# Patient Record
Sex: Male | Born: 1970 | Race: White | Hispanic: No | Marital: Married | Smoking: Former smoker
Health system: Southern US, Community
[De-identification: ages and names within clinical notes are randomized; demographics above are authoritative.]

## PROBLEM LIST (undated history)

## (undated) DIAGNOSIS — IMO0001 Reserved for inherently not codable concepts without codable children: Secondary | ICD-10-CM

## (undated) DIAGNOSIS — M1990 Unspecified osteoarthritis, unspecified site: Secondary | ICD-10-CM

## (undated) DIAGNOSIS — F419 Anxiety disorder, unspecified: Secondary | ICD-10-CM

## (undated) DIAGNOSIS — K219 Gastro-esophageal reflux disease without esophagitis: Secondary | ICD-10-CM

## (undated) DIAGNOSIS — E079 Disorder of thyroid, unspecified: Secondary | ICD-10-CM

## (undated) DIAGNOSIS — E7800 Pure hypercholesterolemia, unspecified: Secondary | ICD-10-CM

## (undated) DIAGNOSIS — I10 Essential (primary) hypertension: Secondary | ICD-10-CM

## (undated) DIAGNOSIS — Z5189 Encounter for other specified aftercare: Secondary | ICD-10-CM

## (undated) HISTORY — DX: Encounter for other specified aftercare: Z51.89

## (undated) HISTORY — DX: Gastro-esophageal reflux disease without esophagitis: K21.9

## (undated) HISTORY — PX: WISDOM TOOTH EXTRACTION: SHX21

## (undated) HISTORY — PX: CARDIAC CATHETERIZATION: SHX172

---

## 1999-11-18 ENCOUNTER — Encounter: Payer: Self-pay | Admitting: Emergency Medicine

## 1999-11-18 ENCOUNTER — Inpatient Hospital Stay (HOSPITAL_COMMUNITY): Admission: EM | Admit: 1999-11-18 | Discharge: 1999-11-20 | Payer: Self-pay | Admitting: Emergency Medicine

## 1999-11-29 ENCOUNTER — Encounter: Admission: RE | Admit: 1999-11-29 | Discharge: 1999-11-29 | Payer: Self-pay | Admitting: Internal Medicine

## 1999-12-01 ENCOUNTER — Encounter: Payer: Self-pay | Admitting: *Deleted

## 1999-12-01 ENCOUNTER — Ambulatory Visit (HOSPITAL_COMMUNITY): Admission: RE | Admit: 1999-12-01 | Discharge: 1999-12-01 | Payer: Self-pay | Admitting: *Deleted

## 2003-05-31 ENCOUNTER — Emergency Department (HOSPITAL_COMMUNITY): Admission: EM | Admit: 2003-05-31 | Discharge: 2003-06-01 | Payer: Self-pay | Admitting: Emergency Medicine

## 2005-04-28 ENCOUNTER — Ambulatory Visit: Payer: Self-pay | Admitting: Family Medicine

## 2005-06-09 ENCOUNTER — Ambulatory Visit: Payer: Self-pay | Admitting: Family Medicine

## 2005-07-12 ENCOUNTER — Ambulatory Visit: Payer: Self-pay | Admitting: Cardiology

## 2005-07-12 ENCOUNTER — Inpatient Hospital Stay (HOSPITAL_COMMUNITY): Admission: AD | Admit: 2005-07-12 | Discharge: 2005-07-13 | Payer: Self-pay | Admitting: Cardiology

## 2005-09-12 ENCOUNTER — Ambulatory Visit: Payer: Self-pay | Admitting: Family Medicine

## 2006-03-17 ENCOUNTER — Ambulatory Visit: Payer: Self-pay | Admitting: Family Medicine

## 2006-04-14 ENCOUNTER — Ambulatory Visit: Payer: Self-pay | Admitting: Family Medicine

## 2006-04-19 ENCOUNTER — Emergency Department (HOSPITAL_COMMUNITY): Admission: EM | Admit: 2006-04-19 | Discharge: 2006-04-19 | Payer: Self-pay | Admitting: Emergency Medicine

## 2006-05-24 ENCOUNTER — Ambulatory Visit: Payer: Self-pay | Admitting: Family Medicine

## 2006-08-08 ENCOUNTER — Ambulatory Visit: Payer: Self-pay | Admitting: Family Medicine

## 2006-10-13 ENCOUNTER — Ambulatory Visit: Payer: Self-pay | Admitting: Family Medicine

## 2006-12-22 ENCOUNTER — Ambulatory Visit: Payer: Self-pay | Admitting: Family Medicine

## 2007-02-24 ENCOUNTER — Emergency Department (HOSPITAL_COMMUNITY): Admission: EM | Admit: 2007-02-24 | Discharge: 2007-02-24 | Payer: Self-pay | Admitting: Emergency Medicine

## 2010-08-26 ENCOUNTER — Inpatient Hospital Stay (HOSPITAL_COMMUNITY): Admission: EM | Admit: 2010-08-26 | Discharge: 2010-08-28 | Payer: Self-pay | Admitting: Emergency Medicine

## 2011-01-12 LAB — CBC
HCT: 41.3 % (ref 39.0–52.0)
Hemoglobin: 13 g/dL (ref 13.0–17.0)
Hemoglobin: 14.1 g/dL (ref 13.0–17.0)
MCH: 31.1 pg (ref 26.0–34.0)
MCV: 91.6 fL (ref 78.0–100.0)
Platelets: 166 10*3/uL (ref 150–400)
Platelets: 181 10*3/uL (ref 150–400)
RBC: 4.13 MIL/uL — ABNORMAL LOW (ref 4.22–5.81)
RBC: 4.52 MIL/uL (ref 4.22–5.81)
WBC: 14.6 10*3/uL — ABNORMAL HIGH (ref 4.0–10.5)

## 2011-01-12 LAB — BASIC METABOLIC PANEL
BUN: 8 mg/dL (ref 6–23)
CO2: 26 mEq/L (ref 19–32)
Calcium: 8.8 mg/dL (ref 8.4–10.5)
Chloride: 107 mEq/L (ref 96–112)
Creatinine, Ser: 0.75 mg/dL (ref 0.4–1.5)
GFR calc Af Amer: >60 mL/min (ref >60)

## 2011-01-12 LAB — CULTURE, BLOOD (ROUTINE X 2)
Culture: NO GROWTH
Culture: NO GROWTH

## 2011-01-12 LAB — DIFFERENTIAL
Basophils Absolute: 0 10*3/uL (ref 0.0–0.1)
Eosinophils Absolute: 0.1 10*3/uL (ref 0.0–0.7)
Eosinophils Absolute: 0.2 10*3/uL (ref 0.0–0.7)
Eosinophils Relative: 1 % (ref 0–5)
Eosinophils Relative: 1 % (ref 0–5)
Lymphocytes Relative: 12 % (ref 12–46)
Lymphs Abs: 1.7 10*3/uL (ref 0.7–4.0)
Monocytes Absolute: 1.1 10*3/uL — ABNORMAL HIGH (ref 0.1–1.0)
Monocytes Relative: 8 % (ref 3–12)
Neutro Abs: 11.6 10*3/uL — ABNORMAL HIGH (ref 1.7–7.7)
Neutrophils Relative %: 73 % (ref 43–77)

## 2011-01-12 LAB — COMPREHENSIVE METABOLIC PANEL
Alkaline Phosphatase: 61 U/L (ref 39–117)
CO2: 24 mEq/L (ref 19–32)
GFR calc non Af Amer: >60 mL/min (ref >60)
Glucose, Bld: 115 mg/dL — ABNORMAL HIGH (ref 70–99)
Sodium: 139 mEq/L (ref 135–145)
Total Bilirubin: 0.6 mg/dL (ref 0.3–1.2)

## 2011-03-18 NOTE — Cardiovascular Report (Signed)
Christian Lynch. Christian Lynch  Patient:    Christian Lynch                         MRN: [id number] Proc. Date: 12/01/99 Adm. Date:  [id number] Attending:  Lenise Lynch H CC:         Christian Lynch, M.D.                        Cardiac Catheterization  PROCEDURES: 1. Left heart catheterization. 2. Coronary angiography. 3. Left ventriculogram.  COMPLICATIONS:  None.  INDICATIONS:  Christian Lynch is a 40 year old white male, patient of Dr. Robin Lynch and Dr. Lenise Lynch with a history of hypertension and substernal chest pain. He has had one previous hospitalization for chest pain which radiates to his left eck and arm associated with diaphoresis.  Subsequently to that admission, he was scheduled for a stress Cardiolite which he underwent in our office and revealed  mildly depressed EF with no evidence of effusion abnormality.  At that time he as noted to have an ECG with LVH strain pattern with a positive ECG.  However, this interpreted and cautioned in the presence of the LVH.  He has a strong family history of CAD with a mother who died of an MI in her 30s.  The patient was counseled extensively in our office.  However, he wishes to proceed with cardiac catheterization to rule out CAD.  DESCRIPTION OF PROCEDURE:  After given informed written consent, the patient was brought to the cardiac catheterization lab where his right and left groins were  shaved, prepped, and draped in the usual sterile fashion.  ECG monitoring was established.  Using modified Seldinger technique, a #6 French arterial sheath was inserted in the right femoral artery without complications.  A 6 French diagnostic catheter was then used to performed diagnostic angiography.  This revealed a large left main with no significant disease.  The LAD is a medium sized vessel which coursed to the apex and gave rise to one  small diagonal branch.  The LAD was a medium sized vessel which  tapered to a very small vessel in its mid and distal portions.  The first diagonal was a small vessel with no significant disease.  The left circumflex is a large vessel which coursed in the AV groove and gave rise to two obtuse marginal branches as well as a small PDA and is noted to be codominant.  The circumflex proper had no significant disease.  The first OM is a medium sized vessel with 20% ostial stenosis.  The second OM is a large vessel which bifurcates distally and has no significant disease.  The third OM is a medium sized vessel with no significant disease.  The right coronary artery is a medium sized vessel which was also noted to be codominant and gives rise to a small PDA.  The RCA was noted to have some spasm in the proximal portion which was relieved with 200 mg of intracoronary nitroglycerin. There is no significant disease noted in the RCA.  LEFT VENTRICULOGRAM:  The left ventriculogram revealed a mildly depressed.  EF calculated at 45-50% with mild global hypokinesis.  There is no significant MR.   HEMODYNAMICS:  Systemic pressure 144/94, LV systemic pressure 144/13 LVEDP of 14.  CONCLUSIONS: 1. No significant CAD. 2. Mildly depressed left ventricular systolic function. 3. Systemic hypertension. DD:  12/01/99 TD:  12/01/99 Job: [id number]

## 2011-03-18 NOTE — H&P (Signed)
NAME:  Christian Lynch, Christian Lynch NO.:  [id number]   MEDICAL RECORD NO.:  [id number]          PATIENT TYPE:  INP   LOCATION:  2001                         FACILITY:  [facility]   PHYSICIAN:  Learta Codding, M.D. LHCDATE OF BIRTH:  January 22, 1971   DATE OF ADMISSION:  07/12/2005  DATE OF DISCHARGE:                                HISTORY & PHYSICAL   REFERRING PHYSICIAN:  Edward L. Juanetta Gosling, MD.   CARDIOLOGIST - NEW:  Learta Codding, MD, [facility].   REASON FOR ADMISSION:  Atypical substernal chest pain.   HISTORY OF PRESENT ILLNESS:  The patient is a 40 year old white male  formerly seen by Dr. Meredith Pel and Dr. Jenne Campus in 2001.  The patient at that  time presented with chest pain with radiation to the left neck and arm  associated with diaphoresis.  He underwent a cardiac catheterization after  he underwent an outpatient Cardiolite study.  Catheterization was  essentially negative for coronary artery disease.  A stress test  demonstrated LVH with some repolarization changes.   The patient two weeks ago on an admission to [facility] for similar  complaints, he reported sudden onset of substernal chest pain at rest  without nausea or diaphoresis.  The patient's pain was mid sternal and  lasting for several hours.  He was admitted and ruled out for myocardial  infarction and seen by Dr. Olena Leatherwood.  No followup stress testing, however,  was done, and the patient was discharged the next morning.  Today while at  [facility], the patient again complained of an episode of severe, substernal,  chest pain, which he rated an 8/10 but without diaphoresis or nausea.  He  also had no associated shortness of breath.  He initially went to go to  [place] but then decided to go to [facility] ER.  Reportedly he sat there  for several hours until ultimately he received some nitroglycerin.  At that  point, his chest pain eased off from an 8 to a 5/10.  A decision was made to  transfer the patient to [facility] for possible cardiac  catheterization.  The patient's chest pain was resolved on admission to  [facility].  The patient denies any exertional chest pain or exertional  shortness of breath.  He also has two cardiac risk factors and does not  smoke anymore.  Contrary to what has been recorded, the patient does not  have a family history of coronary artery disease.   PAST MEDICAL HISTORY:  1.  Hypothyroidism.  2.  Hypertension.  3.  Anxiety.   ALLERGIES:  MORPHINE causes nausea.   FAMILY HISTORY:  Both mother and father had heart disease but at a later  age.  He has four sisters, who have no heart disease.  He had a brother, who  died from gangrene after trauma.   SOCIAL HISTORY:  The patient works in [employer] and lives with his wife.  He has one child, who is in good health.  He denies any tobacco use.  He  quit approximately four years ago.  He did use to  smoke two packs a day.   MEDICATIONS:  1.  Benicar 20 mg p.o. daily.  2.  Synthroid 150 mcg p.o. daily.  3.  Ativan 0.5 mg p.o. daily.   REVIEW OF SYSTEMS:  As per HPI.  No nausea or vomiting.  No fever or chills.  No orthopnea or PND.  No palpitations or syncope.  No dysuria or frequency.  No melena, no abdominal pain.  The remainder of the Review of Systems is  negative.   PHYSICAL EXAMINATION:  VITAL SIGNS:  Blood pressure 148/97, heart rate 81  beats per minute, respirations 20.  GENERAL:  A well-nourished white male in no apparent distress.  NECK:  Normal carotid upstroke.  No carotid bruits.  LUNGS:  Clear breath sounds bilaterally.  HEART:  Regular rate and rhythm.  Normal S1 and S2.  No S3 or S4.  No  murmurs.  ABDOMEN:  Soft and nontender with no rebound or guarding.  Good bowel  sounds.  EXTREMITIES:  No cyanosis, clubbing, or edema.  NEURO:  The patient is alert, oriented, and grossly nonfocal.   LABORATORY DATA:  Laboratory work performed at [facility] reveals a  sodium of 138, potassium  4.1, chloride 100, carbon dioxide 29, glucose 96,  BUN 9, creatinine 0.9, calcium 9.3,  albumin 4.  LFTs within normal limits.  Initial troponin within normal limits less than 0.5 x2 markers.  The white  count is 8.1, hemoglobin 13.6, platelet count is 213.   EKG demonstrates left ventricular hypertrophy with nonspecific ST T-wave  changes versus a strained pattern.   Chest x-ray pending.   PROBLEM LIST:  1.  Atypical substernal chest pain.      1.  Rule out acute coronary syndrome.  Negative cardiac catheterization          in 2001.      2.  Rule out esophageal spasm.      3.  Musculoskeletal pain.  2.  Hypertension.  3.  Hypothyroidism.  4.  Anxiety.  5.  ALLERGY TO MORPHINE.   PLAN:  1.  Rule out myocardial infarction and check enzymes.  If enzymes are      negative, I do think the patient can proceed with an exercise Cardiolite      study in the morning.  However, there is some discrepancy in the      patient's understanding of the results of his prior cardiac      catheterization.  He stated that he has an ostial 85% stenosis of some      coronaryartery.  He does not know the details.  However, his      catheterization report  does not demonstrate evidence of significant      coronary artery disease, but there are discrepancies in the family      history reported in the catheterization report.  Therefore, angiographic      images probably will need to be reviewed to make sure that the dictation      matches the angiographic data.  If the patient indeed has an 85%      stenosis, the plan may need to be to proceed with cardiac      catheterization.  2.  Aspirin in the interim.  Will hold Lovenox.  The patient has no      recurrent chest pain.  3.  Continue Benicar for hypertension.  4.  Nitroglycerin p.r.n.  Will continue also Alprazolam. Add H2 blocker 5.  I discussed the  plan with the patient and he has agreed to initially      proceed with an exercise Cardiolite data as  long as there is no      discrepancy in his cardiac cath data from 2001.      Learta Codding, M.D. [facility]  Electronically Signed     GED/MEDQ  D:  07/12/2005  T:  07/12/2005  Job:  [id number]   cc:   Ramon Dredge L. Juanetta Gosling, M.D.  [address]  [place]  [place] [address]  Fax: [phone]

## 2011-03-18 NOTE — Discharge Summary (Signed)
NAME:  Christian Lynch, Christian Lynch NO.:  [id number]   MEDICAL RECORD NO.:  [id number]          PATIENT TYPE:  INP   LOCATION:  2001                         FACILITY:  [facility]   PHYSICIAN:  Olga Millers, M.D. LHCDATE OF BIRTH:  1971-10-29   DATE OF ADMISSION:  07/12/2005  DATE OF DISCHARGE:  07/13/2005                                 DISCHARGE SUMMARY   PRIMARY CARDIOLOGIST:  Dr. Lewayne Bunting.   PRIMARY CARE PHYSICIAN:  Dr. Juanetta Gosling in [place].   PRINCIPAL DIAGNOSIS:  Atypical chest pain.   OTHER DIAGNOSES:  1.  Hypertension.  2.  Anxiety.  3.  Hypothyroidism.   ALLERGIES:  MORPHINE.   PROCEDURES:  Exercise Myoview.   HISTORY OF PRESENT ILLNESS:  A 40 year old white male with a prior history  of chest pain and normal catheterization in 2001.  He was recently evaluated  at [facility] approximately 2 weeks ago secondary to complaints of  mid sternal chest discomfort occurring at rest without associated symptoms,  lasting several hours.  He was ruled out during that admission and  discharged the following morning.  On September 12th, while out at [employer], he developed recurrent 8/10 substernal chest pain without  associated symptoms and initially went to go to [place] but then decided to  go to [facility] ER.  He was treated with sublingual nitroglycerin with  reduction of chest pain from 8/10 to 5/10 and decision was made to transfer  him to [facility] for further evaluation.   HOSPITAL COURSE:  Following arrival to [facility] he was pain-free.  Cardiac  markers were cycled and were negative.  As a result, he underwent exercise  Myoview this morning, exercising for a total of 9 minutes, achieving a  target heart rate of 156 b.p.m.  He had no chest pain or significant EKG  changes and only mild dyspnea with exercise.  Final procedure and imaging  revealed no areas of ischemia or infarct.  As a result, he has been  initiated on PPI therapy and is being  discharged home today in satisfactory  condition.   DISCHARGE LABS:  Hemoglobin 13.6, hematocrit 39.5, WBC 8.1, platelets 213,  MCV 88.6.  Sodium 138, potassium 4.1, chloride 100, CO2 29, BUN 9,  creatinine 0.9, glucose 96.  Total bilirubin 0.3, alkaline phosphatase 73,  AST 26, ALT 33, albumin 4.0.  Cardiac enzymes negative x2.  Calcium 9.3, D-  dimer was negative at 0.25, BNP was less than 30, TSH was normal at 0.420.   DISPOSITION:  The patient is being discharged home today in good condition.   FOLLOWUP PLANS AND APPOINTMENTS:  He is asked to followup with his primary  care physician, Dr. Juanetta Gosling, in [place] in 1-2 weeks.  He is to followup  with Dr. Andee Lineman at [facility], in [place], on September 27th at 12:30  p.m.   DISCHARGE MEDICATIONS:  1.  Synthroid 175 mcg daily.  2.  Ativan 0.5 mg t.i.d.  3.  Benicar 20 mg daily.  4.  Protonix 40 mg daily.   OUTSTANDING LAB STUDIES:  None.   DURATION  OF DISCHARGE ENCOUNTER:  35 minutes.      Ok Anis, NP    ______________________________  Olga Millers, M.D. [facility]    CRB/MEDQ  D:  07/13/2005  T:  07/13/2005  Job:  [id number]   cc:   Learta Codding, M.D. [facility]  [address]  [address]  [place]  [place] [address]   Oneal Deputy. Juanetta Gosling, M.D.  [address]  [place]  [place] [address]  Fax: [phone]

## 2014-12-27 ENCOUNTER — Encounter (HOSPITAL_COMMUNITY): Payer: Self-pay | Admitting: *Deleted

## 2014-12-27 ENCOUNTER — Emergency Department (HOSPITAL_COMMUNITY)
Admission: EM | Admit: 2014-12-27 | Discharge: 2014-12-28 | Disposition: A | Discharge status: Home / Self Care | Payer: BLUE CROSS/BLUE SHIELD | Attending: Emergency Medicine | Admitting: Emergency Medicine

## 2014-12-27 DIAGNOSIS — E079 Disorder of thyroid, unspecified: Secondary | ICD-10-CM | POA: Insufficient documentation

## 2014-12-27 DIAGNOSIS — Z79899 Other long term (current) drug therapy: Secondary | ICD-10-CM | POA: Diagnosis not present

## 2014-12-27 DIAGNOSIS — E780 Pure hypercholesterolemia: Secondary | ICD-10-CM | POA: Insufficient documentation

## 2014-12-27 DIAGNOSIS — K529 Noninfective gastroenteritis and colitis, unspecified: Secondary | ICD-10-CM | POA: Diagnosis not present

## 2014-12-27 DIAGNOSIS — R112 Nausea with vomiting, unspecified: Secondary | ICD-10-CM | POA: Diagnosis present

## 2014-12-27 DIAGNOSIS — Z792 Long term (current) use of antibiotics: Secondary | ICD-10-CM | POA: Diagnosis not present

## 2014-12-27 DIAGNOSIS — K219 Gastro-esophageal reflux disease without esophagitis: Secondary | ICD-10-CM | POA: Insufficient documentation

## 2014-12-27 DIAGNOSIS — Z720 Tobacco use: Secondary | ICD-10-CM | POA: Insufficient documentation

## 2014-12-27 DIAGNOSIS — F419 Anxiety disorder, unspecified: Secondary | ICD-10-CM | POA: Diagnosis not present

## 2014-12-27 DIAGNOSIS — I10 Essential (primary) hypertension: Secondary | ICD-10-CM | POA: Diagnosis not present

## 2014-12-27 HISTORY — DX: Anxiety disorder, unspecified: F41.9

## 2014-12-27 HISTORY — DX: Reserved for inherently not codable concepts without codable children: IMO0001

## 2014-12-27 HISTORY — DX: Essential (primary) hypertension: I10

## 2014-12-27 HISTORY — DX: Gastro-esophageal reflux disease without esophagitis: K21.9

## 2014-12-27 HISTORY — DX: Pure hypercholesterolemia, unspecified: E78.00

## 2014-12-27 HISTORY — DX: Disorder of thyroid, unspecified: E07.9

## 2014-12-27 HISTORY — DX: Unspecified osteoarthritis, unspecified site: M19.90

## 2014-12-27 LAB — URINALYSIS, ROUTINE W REFLEX MICROSCOPIC
BILIRUBIN URINE: NEGATIVE
GLUCOSE, UA: NEGATIVE mg/dL
HGB URINE DIPSTICK: NEGATIVE
Ketones, ur: NEGATIVE mg/dL
LEUKOCYTES UA: NEGATIVE
Nitrite: NEGATIVE
PROTEIN: NEGATIVE mg/dL
SPECIFIC GRAVITY, URINE: 1.025 (ref 1.005–1.030)
UROBILINOGEN UA: 0.2 mg/dL (ref 0.0–1.0)
pH: 7 (ref 5.0–8.0)

## 2014-12-27 LAB — COMPREHENSIVE METABOLIC PANEL
ALK PHOS: 57 U/L (ref 39–117)
ALT: 32 U/L (ref 0–53)
AST: 24 U/L (ref 0–37)
Albumin: 4.3 g/dL (ref 3.5–5.2)
Anion gap: 10 (ref 5–15)
BILIRUBIN TOTAL: 0.6 mg/dL (ref 0.3–1.2)
BUN: 15 mg/dL (ref 6–23)
CALCIUM: 9 mg/dL (ref 8.4–10.5)
CO2: 24 mmol/L (ref 19–32)
CREATININE: 0.87 mg/dL (ref 0.50–1.35)
Chloride: 105 mmol/L (ref 96–112)
GFR calc Af Amer: >90 mL/min (ref >90)
Glucose, Bld: 103 mg/dL — ABNORMAL HIGH (ref 70–99)
Potassium: 3.3 mmol/L — ABNORMAL LOW (ref 3.5–5.1)
Sodium: 139 mmol/L (ref 135–145)
TOTAL PROTEIN: 7.6 g/dL (ref 6.0–8.3)

## 2014-12-27 LAB — CBC WITH DIFFERENTIAL/PLATELET
Basophils Absolute: 0 10*3/uL (ref 0.0–0.1)
Basophils Relative: 0 % (ref 0–1)
EOS ABS: 0.4 10*3/uL (ref 0.0–0.7)
EOS PCT: 3 % (ref 0–5)
HCT: 43.9 % (ref 39.0–52.0)
Hemoglobin: 15.6 g/dL (ref 13.0–17.0)
LYMPHS PCT: 26 % (ref 12–46)
Lymphs Abs: 3.4 10*3/uL (ref 0.7–4.0)
MCH: 32.8 pg (ref 26.0–34.0)
MCHC: 35.5 g/dL (ref 30.0–36.0)
MCV: 92.2 fL (ref 78.0–100.0)
MONOS PCT: 7 % (ref 3–12)
Monocytes Absolute: 0.9 10*3/uL (ref 0.1–1.0)
NEUTROS ABS: 8.3 10*3/uL — AB (ref 1.7–7.7)
NEUTROS PCT: 64 % (ref 43–77)
Platelets: 160 10*3/uL (ref 150–400)
RBC: 4.76 MIL/uL (ref 4.22–5.81)
RDW: 12.5 % (ref 11.5–15.5)
WBC: 13 10*3/uL — ABNORMAL HIGH (ref 4.0–10.5)

## 2014-12-27 MED ORDER — PROMETHAZINE HCL 25 MG/ML IJ SOLN
12.5000 mg | Freq: Once | INTRAMUSCULAR | Status: AC
  Administered 2014-12-27: 12.5 mg via INTRAVENOUS
  Filled 2014-12-27: qty 1

## 2014-12-27 MED ORDER — PANTOPRAZOLE SODIUM 40 MG IV SOLR
40.0000 mg | Freq: Once | INTRAVENOUS | Status: AC
Start: 2014-12-27 — End: 2014-12-27
  Administered 2014-12-27: 40 mg via INTRAVENOUS
  Filled 2014-12-27: qty 40

## 2014-12-27 MED ORDER — SODIUM CHLORIDE 0.9 % IV BOLUS (SEPSIS)
1000.0000 mL | Freq: Once | INTRAVENOUS | Status: AC
  Administered 2014-12-27: 1000 mL via INTRAVENOUS

## 2014-12-27 NOTE — ED Provider Notes (Signed)
CSN: [id number]     Arrival date & time 12/27/14  2118 History  This chart was scribed for Donnetta Hutching, MD by [place] Peace, ED Scribe. The patient was seen in [facility]. The patient's care was started at 10:21 PM.     Chief Complaint  Patient presents with  . Emesis  . Otalgia      Patient is a 44 y.o. male presenting with vomiting and ear pain. The history is provided by the patient. No language interpreter was used.  Emesis Associated symptoms: abdominal pain and sore throat   Otalgia Associated symptoms: abdominal pain, cough, fever, sore throat and vomiting   HPI Comments: Christian Lynch is a 44 y.o. male who presents to the Emergency Department complaining of ear pain, sore throat, cough, headache, and fever x 1 week. Pt also reports nausea, 5 episodes of vomiting, and cramping that started today. Wife denies any recent sick contacts. She adds that pt has been on clarithromycin since Tuesday that he was prescribed during a visit at [facility]. Blood noted in emesis while pt was in waiting room. History of anxiety, hypertension. Pt is current everyday smoker.    Past Medical History  Diagnosis Date  . Thyroid disease   . Reflux   . Arthritis   . Hypertension   . Hypercholesterolemia   . Anxiety    History reviewed. No pertinent past surgical history. History reviewed. No pertinent family history. History  Substance Use Topics  . Smoking status: Current Every Day Smoker -- 1.00 packs/day    Types: Cigarettes  . Smokeless tobacco: Not on file  . Alcohol Use: Yes     Comment: rare social    Review of Systems  Constitutional: Positive for fever.  HENT: Positive for ear pain and sore throat.   Respiratory: Positive for cough.   Gastrointestinal: Positive for nausea, vomiting and abdominal pain.  Genitourinary: Positive for hematuria.      Allergies  Morphine and related and Zofran  Home Medications   Prior to Admission medications   Medication Sig Start Date End  Date Taking? Authorizing Provider  clarithromycin (BIAXIN XL) 500 MG 24 hr tablet Take 500 mg by mouth 2 (two) times daily. 12/23/14  Yes Historical Provider, MD  levothyroxine (SYNTHROID, LEVOTHROID) 112 MCG tablet Take 112 mcg by mouth at bedtime.  10/17/14  Yes Historical Provider, MD  losartan-hydrochlorothiazide (HYZAAR) 50-12.5 MG per tablet Take 1 tablet by mouth at bedtime.  10/17/14  Yes Historical Provider, MD  meloxicam (MOBIC) 7.5 MG tablet Take 15 mg by mouth at bedtime.  10/17/14  Yes Historical Provider, MD  Omega-3 Fatty Acids (FISH OIL) 1000 MG CAPS Take 3 capsules by mouth at bedtime.   Yes Historical Provider, MD  omeprazole (PRILOSEC) 20 MG capsule Take 20 mg by mouth at bedtime.  10/17/14  Yes Historical Provider, MD  pravastatin (PRAVACHOL) 80 MG tablet Take 80 mg by mouth at bedtime.  10/17/14  Yes Historical Provider, MD  famotidine (PEPCID) 20 MG tablet Take 1 tablet (20 mg total) by mouth 2 (two) times daily. 12/28/14   Donnetta Hutching, MD  LORazepam (ATIVAN) 1 MG tablet Take 1 mg by mouth 3 (three) times daily as needed. 10/17/14   Historical Provider, MD  promethazine (PHENERGAN) 25 MG tablet Take 1 tablet (25 mg total) by mouth every 6 (six) hours as needed. 12/28/14   Donnetta Hutching, MD   BP 177/100 mmHg  Pulse 63  Temp(Src) 98.7 F (37.1 C) (Oral)  Resp 14  Ht 5\' 7"  (1.702 m)  Wt 150 lb (68.04 kg)  BMI 23.49 kg/m2  SpO2 100% Physical Exam  Constitutional: He is oriented to person, place, and time. He appears well-developed and well-nourished.  HENT:  Head: Normocephalic and atraumatic.  Eyes: Conjunctivae and EOM are normal. Pupils are equal, round, and reactive to light.  Neck: Normal range of motion. Neck supple.  Cardiovascular: Normal rate and regular rhythm.   Pulmonary/Chest: Effort normal and breath sounds normal.  Abdominal: Soft. Bowel sounds are normal.  Musculoskeletal: Normal range of motion.  Neurological: He is alert and oriented to person, place, and  time.  Skin: Skin is warm and dry.  Psychiatric: He has a normal mood and affect. His behavior is normal.  Nursing note and vitals reviewed.   ED Course  Procedures (including critical care time) Labs Review Labs Reviewed  CBC WITH DIFFERENTIAL/PLATELET - Abnormal; Notable for the following:    WBC 13.0 (*)    Neutro Abs 8.3 (*)    All other components within normal limits  COMPREHENSIVE METABOLIC PANEL - Abnormal; Notable for the following:    Potassium 3.3 (*)    Glucose, Bld 103 (*)    All other components within normal limits  URINALYSIS, ROUTINE W REFLEX MICROSCOPIC    Imaging Review No results found.   EKG Interpretation None     Medications  sodium chloride 0.9 % bolus 1,000 mL (0 mLs Intravenous Stopped 12/28/14 0054)  pantoprazole (PROTONIX) injection 40 mg (40 mg Intravenous Given 12/27/14 2259)  promethazine (PHENERGAN) injection 12.5 mg (12.5 mg Intravenous Given 12/27/14 2259)  sodium chloride 0.9 % bolus 1,000 mL (0 mLs Intravenous Stopped 12/27/14 2345)    10:25 PM- Treatment plan was discussed with patient who verbalizes understanding and agrees.   MDM   Final diagnoses:  Gastroenteritis    No acute abdomen.  Patient feels much better after 2 L of IV fluids, IV Protonix, IV Phenergan. Suspect idiopathic gastroenteritis.discharge medications Phenergan 25 mg and Pepcid 20 mg. Discussed with patient and his wife.  I personally performed the services described in this documentation, which was scribed in my presence. The recorded information has been reviewed and is accurate.    Donnetta HutchingBrian Ellie Spickler, MD 12/28/14 [phone]

## 2014-12-27 NOTE — ED Notes (Signed)
MD at bedside. 

## 2014-12-27 NOTE — ED Notes (Addendum)
Ear pain, sore throat, cough, fevers x 1 week.  Nausea, vomiting began yesterday.  5 x in last 2 hours. Blood in emesis while in waiting room.  Was seen at [facility] last weekend and has been on Clarithromycin since then.

## 2014-12-28 MED ORDER — PROMETHAZINE HCL 25 MG PO TABS: 25.0000 mg | ORAL_TABLET | Freq: Four times a day (QID) | ORAL | Status: DC | PRN

## 2014-12-28 MED ORDER — FAMOTIDINE 20 MG PO TABS: 20.0000 mg | ORAL_TABLET | Freq: Two times a day (BID) | ORAL | Status: DC

## 2014-12-28 NOTE — Discharge Instructions (Signed)
Stop smoking. Stop your antibiotic. Prescriptions for nausea medication and ulcer medication. Increase fluids.

## 2017-04-29 ENCOUNTER — Encounter (HOSPITAL_COMMUNITY): Payer: Self-pay | Admitting: *Deleted

## 2017-04-29 ENCOUNTER — Emergency Department (HOSPITAL_COMMUNITY)
Admission: EM | Admit: 2017-04-29 | Discharge: 2017-04-30 | Disposition: A | Discharge status: Home / Self Care | Payer: PRIVATE HEALTH INSURANCE | Attending: Emergency Medicine | Admitting: Emergency Medicine

## 2017-04-29 DIAGNOSIS — E876 Hypokalemia: Secondary | ICD-10-CM | POA: Insufficient documentation

## 2017-04-29 DIAGNOSIS — E860 Dehydration: Secondary | ICD-10-CM

## 2017-04-29 DIAGNOSIS — Z79899 Other long term (current) drug therapy: Secondary | ICD-10-CM | POA: Insufficient documentation

## 2017-04-29 DIAGNOSIS — Z791 Long term (current) use of non-steroidal anti-inflammatories (NSAID): Secondary | ICD-10-CM | POA: Insufficient documentation

## 2017-04-29 DIAGNOSIS — F17210 Nicotine dependence, cigarettes, uncomplicated: Secondary | ICD-10-CM | POA: Insufficient documentation

## 2017-04-29 DIAGNOSIS — I10 Essential (primary) hypertension: Secondary | ICD-10-CM | POA: Insufficient documentation

## 2017-04-29 DIAGNOSIS — R531 Weakness: Secondary | ICD-10-CM | POA: Insufficient documentation

## 2017-04-29 DIAGNOSIS — R51 Headache: Secondary | ICD-10-CM | POA: Insufficient documentation

## 2017-04-29 MED ORDER — SODIUM CHLORIDE 0.9 % IV BOLUS (SEPSIS)
500.0000 mL | Freq: Once | INTRAVENOUS | Status: AC
  Administered 2017-04-30: 500 mL via INTRAVENOUS

## 2017-04-29 MED ORDER — SODIUM CHLORIDE 0.9 % IV BOLUS (SEPSIS)
1000.0000 mL | Freq: Once | INTRAVENOUS | Status: AC
  Administered 2017-04-30: 1000 mL via INTRAVENOUS

## 2017-04-29 NOTE — ED Triage Notes (Addendum)
Pt reports having several episodes of feeling like he was going pass out x 2 weeks (or longer). Pt also reports having a headache x 2-3 days, seeing black spots, and nausea. Pt was seen on June 22nd for similar symptoms and the doctor "didn't tell him anything really."

## 2017-04-29 NOTE — ED Notes (Signed)
Patient states that he is having a headache on pain scale of 2 with some nausea. States that he had some blurry vision early.

## 2017-04-30 ENCOUNTER — Emergency Department (HOSPITAL_COMMUNITY): Payer: PRIVATE HEALTH INSURANCE

## 2017-04-30 LAB — CBC WITH DIFFERENTIAL/PLATELET
Basophils Absolute: 0.1 10*3/uL (ref 0.0–0.1)
Basophils Relative: 1 %
Eosinophils Absolute: 0.4 10*3/uL (ref 0.0–0.7)
Eosinophils Relative: 4 %
HCT: 42.5 % (ref 39.0–52.0)
Hemoglobin: 14.5 g/dL (ref 13.0–17.0)
LYMPHS ABS: 3.1 10*3/uL (ref 0.7–4.0)
LYMPHS PCT: 32 %
MCH: 32.5 pg (ref 26.0–34.0)
MCHC: 34.1 g/dL (ref 30.0–36.0)
MCV: 95.3 fL (ref 78.0–100.0)
MONO ABS: 0.6 10*3/uL (ref 0.1–1.0)
Monocytes Relative: 6 %
Neutro Abs: 5.6 10*3/uL (ref 1.7–7.7)
Neutrophils Relative %: 57 %
Platelets: 168 10*3/uL (ref 150–400)
RBC: 4.46 MIL/uL (ref 4.22–5.81)
RDW: 13.2 % (ref 11.5–15.5)
WBC: 9.7 10*3/uL (ref 4.0–10.5)

## 2017-04-30 LAB — BASIC METABOLIC PANEL
Anion gap: 10 (ref 5–15)
BUN: 13 mg/dL (ref 6–20)
CALCIUM: 8.9 mg/dL (ref 8.9–10.3)
CO2: 26 mmol/L (ref 22–32)
CREATININE: 0.81 mg/dL (ref 0.61–1.24)
Chloride: 106 mmol/L (ref 101–111)
GFR calc Af Amer: >60 mL/min (ref >60)
GLUCOSE: 100 mg/dL — AB (ref 65–99)
Potassium: 3.3 mmol/L — ABNORMAL LOW (ref 3.5–5.1)
Sodium: 142 mmol/L (ref 135–145)

## 2017-04-30 LAB — URINALYSIS, ROUTINE W REFLEX MICROSCOPIC
BILIRUBIN URINE: NEGATIVE
GLUCOSE, UA: NEGATIVE mg/dL
HGB URINE DIPSTICK: NEGATIVE
KETONES UR: NEGATIVE mg/dL
Leukocytes, UA: NEGATIVE
Nitrite: NEGATIVE
Protein, ur: NEGATIVE mg/dL
Specific Gravity, Urine: 1.025 (ref 1.005–1.030)
pH: 5 (ref 5.0–8.0)

## 2017-04-30 LAB — CK: Total CK: 104 U/L (ref 49–397)

## 2017-04-30 MED ORDER — POTASSIUM CHLORIDE ER 20 MEQ PO TBCR: 20.0000 meq | EXTENDED_RELEASE_TABLET | Freq: Two times a day (BID) | ORAL | 0 refills | Status: DC

## 2017-04-30 MED ORDER — POTASSIUM CHLORIDE CRYS ER 20 MEQ PO TBCR
40.0000 meq | EXTENDED_RELEASE_TABLET | Freq: Once | ORAL | Status: AC
  Administered 2017-04-30: 40 meq via ORAL
  Filled 2017-04-30: qty 2

## 2017-04-30 MED ORDER — SODIUM CHLORIDE 0.9 % IV BOLUS (SEPSIS)
1000.0000 mL | Freq: Once | INTRAVENOUS | Status: AC
  Administered 2017-04-30: 1000 mL via INTRAVENOUS

## 2017-04-30 NOTE — Discharge Instructions (Signed)
Drink more fluids like sports drinks when working in a hot environment. Your potassium level was a little low in the ED, take the potassium pills until gone. Please have your doctor recheck your blood pressure this week, your blood pressure was a little high in the ED at 175/99.

## 2017-04-30 NOTE — ED Provider Notes (Signed)
[facility] Provider Note   CSN: [id number] Arrival date & time: 04/29/17  2129  Time seen 23:42 PM   History   Chief Complaint Chief Complaint  Patient presents with  . Headache  . Fatigue    HPI Christian Lynch is a 46 y.o. male.  HPI  patient states for the past week he's had nausea without vomiting or diarrhea. He complains of lower abdominal cramps bilaterally that come and go and last about 30 minutes. Nothing he does including eating makes it feel better or worse. He also complains of intermittent frontal headaches that radiate to the back of his head described as sharp that last 1-2 hours. He states they are throbbing and sometimes makes him have blurred vision. He denies nausea or vomiting or new numbness or tingling in his extremities with the headache. He states his hands get numb whenever he doesn't use them for a while and that has been going on for years and is not associated with the headache. He denies any fever and denies any recent exposure to ticks although he states he was seen last week by his PCP and had negative tests for [facility] spotted fever or Lyme's disease. He states the past 2 mornings at work including this morning around 8 he was working in a hot environment and sweating a lot. He was having abdominal pain and nausea and felt like he was going to pass out. He has been working at this job for the past 4 months and states it's very hot because they are working around [employer]. He states he has felt short of breath for 2-3 days and has had a dry cough without chest pain. He states he notices he feels worse at the end of his working day and feels better when he has had a few days off. He has been seen by his PCP twice in the last 2 weeks. He used to be on blood pressure medication however he did not take them for 2 years. He states when he was in the office this past week twice his blood pressure was fine. He states his doctor decided to not put him back  on blood pressure medication.  PCP Joette Catching, MD   Past Medical History:  Diagnosis Date  . Anxiety   . Arthritis   . Hypercholesterolemia   . Hypertension   . Reflux   . Thyroid disease     There are no active problems to display for this patient.   History reviewed. No pertinent surgical history.     Home Medications    Prior to Admission medications   Medication Sig Start Date End Date Taking? Authorizing Provider  acetaminophen (TYLENOL) 500 MG tablet Take 1,000 mg by mouth every 6 (six) hours as needed for mild pain, moderate pain or headache.   Yes [provider]  levothyroxine (SYNTHROID, LEVOTHROID) 25 MCG tablet Take 25 mcg by mouth at bedtime. Take during week 1, then increase to during week 2, then increase to during week 3, then increase to during week 4, and finally increase to during week 5 and stay at that dose 10/17/14  Yes [provider]  meloxicam (MOBIC) 7.5 MG tablet Take 15 mg by mouth at bedtime.  10/17/14  Yes [provider]  omeprazole (PRILOSEC) 20 MG capsule Take 20 mg by mouth at bedtime.  10/17/14  Yes [provider]  potassium chloride 20 MEQ TBCR Take 20 mEq by mouth  2 (two) times daily. 04/30/17   Devoria Albe, MD    Family History History reviewed. No pertinent family history.  Social History Social History  Substance Use Topics  . Smoking status: Current Every Day Smoker    Packs/day: 1.00    Types: Cigarettes  . Smokeless tobacco: Never Used  . Alcohol use Yes     Comment: rare social  employed Lives with spouse   Allergies   Clarithromycin; Morphine and related; and Zofran [ondansetron hcl]   Review of Systems Review of Systems  All other systems reviewed and are negative.    Physical Exam Updated Vital Signs BP (!) 174/109 (BP Location: Right Arm)   Pulse 60   Temp 98.7 F (37.1 C) (Oral)   Resp (!) 21   Ht 5\' 7"  (1.702 m)   Wt 63.5 kg (140  lb)   SpO2 98%   BMI 21.93 kg/m   Vital signs normal except hypertension  Orthostatic VS for the past 24 hrs:  BP- Lying Pulse- Lying BP- Sitting Pulse- Sitting BP- Standing at 0 minutes Pulse- Standing at 0 minutes  04/29/17 2335 (!) 168/101 57 (!) 177/111 59 (!) 174/109 54     Normal orthostatics except for persistent hypertension    Physical Exam  Constitutional: He is oriented to person, place, and time. He appears well-developed and well-nourished.  Non-toxic appearance. He does not appear ill. No distress.  HENT:  Head: Normocephalic and atraumatic.  Right Ear: External ear normal.  Left Ear: External ear normal.  Nose: Nose normal. No mucosal edema or rhinorrhea.  Mouth/Throat: Oropharynx is clear and moist. Mucous membranes are dry. No dental abscesses or uvula swelling.  Eyes: Conjunctivae and EOM are normal. Pupils are equal, round, and reactive to light.  Neck: Normal range of motion and full passive range of motion without pain. Neck supple.  Cardiovascular: Normal rate, regular rhythm and normal heart sounds.  Exam reveals no gallop and no friction rub.   No murmur heard. Pulmonary/Chest: Effort normal and breath sounds normal. No respiratory distress. He has no wheezes. He has no rhonchi. He has no rales. He exhibits no tenderness and no crepitus.  Abdominal: Soft. Normal appearance and bowel sounds are normal. He exhibits no distension. There is no tenderness. There is no rebound and no guarding.  Musculoskeletal: Normal range of motion. He exhibits no edema or tenderness.  Moves all extremities well.   Neurological: He is alert and oriented to person, place, and time. He has normal strength. No cranial nerve deficit.  Skin: Skin is warm, dry and intact. No rash noted. No erythema. No pallor.  Psychiatric: He has a normal mood and affect. His speech is normal and behavior is normal. His mood appears not anxious.  Nursing note and vitals reviewed.    ED  Treatments / Results  Labs (all labs ordered are listed, but only abnormal results are displayed) Results for orders placed or performed during the hospital encounter of 04/29/17  CBC with Differential  Result Value Ref Range   WBC 9.7 4.0 - 10.5 K/uL   RBC 4.46 4.22 - 5.81 MIL/uL   Hemoglobin 14.5 13.0 - 17.0 g/dL   HCT 16.1 09.6 - 04.5 %   MCV 95.3 78.0 - 100.0 fL   MCH 32.5 26.0 - 34.0 pg   MCHC 34.1 30.0 - 36.0 g/dL   RDW 40.9 81.1 - 91.4 %   Platelets 168 150 - 400 K/uL   Neutrophils Relative % 57 %  Neutro Abs 5.6 1.7 - 7.7 K/uL   Lymphocytes Relative 32 %   Lymphs Abs 3.1 0.7 - 4.0 K/uL   Monocytes Relative 6 %   Monocytes Absolute 0.6 0.1 - 1.0 K/uL   Eosinophils Relative 4 %   Eosinophils Absolute 0.4 0.0 - 0.7 K/uL   Basophils Relative 1 %   Basophils Absolute 0.1 0.0 - 0.1 K/uL  Basic metabolic panel  Result Value Ref Range   Sodium 142 135 - 145 mmol/L   Potassium 3.3 (L) 3.5 - 5.1 mmol/L   Chloride 106 101 - 111 mmol/L   CO2 26 22 - 32 mmol/L   Glucose, Bld 100 (H) 65 - 99 mg/dL   BUN 13 6 - 20 mg/dL   Creatinine, Ser 1.61 0.61 - 1.24 mg/dL   Calcium 8.9 8.9 - 09.6 mg/dL   GFR calc non Af Amer >60 >60 mL/min   GFR calc Af Amer >60 >60 mL/min   Anion gap 10 5 - 15  CK  Result Value Ref Range   Total CK 104 49 - 397 U/L  Urinalysis, Routine w reflex microscopic  Result Value Ref Range   Color, Urine YELLOW YELLOW   APPearance CLEAR CLEAR   Specific Gravity, Urine 1.025 1.005 - 1.030   pH 5.0 5.0 - 8.0   Glucose, UA NEGATIVE NEGATIVE mg/dL   Hgb urine dipstick NEGATIVE NEGATIVE   Bilirubin Urine NEGATIVE NEGATIVE   Ketones, ur NEGATIVE NEGATIVE mg/dL   Protein, ur NEGATIVE NEGATIVE mg/dL   Nitrite NEGATIVE NEGATIVE   Leukocytes, UA NEGATIVE NEGATIVE   Laboratory interpretation all normal except mild hypokalemia     EKG  EKG Interpretation  Date/Time:  Saturday April 29 2017 23:43:29 EDT Ventricular Rate:  59 PR Interval:    QRS  Duration: 107 QT Interval:  436 QTC Calculation: 432 R Axis:   -11 Text Interpretation:  Sinus rhythm Left ventricular hypertrophy No significant change since last tracing 19 Nov 1999 Confirmed by Devoria Albe ([id number]) on 04/29/2017 11:57:01 PM       Radiology Dg Chest 2 View  Result Date: 04/30/2017 CLINICAL DATA:  Shortness of breath with dry cough EXAM: CHEST  2 VIEW COMPARISON:  Report 12/23/2014 FINDINGS: No acute consolidation or pleural effusion. Normal cardiomediastinal silhouette. No pneumothorax. IMPRESSION: No active cardiopulmonary disease. Electronically Signed   By: Jasmine Pang M.D.   On: 04/30/2017 01:28    Procedures Procedures (including critical care time)  Medications Ordered in ED Medications  sodium chloride 0.9 % bolus 1,000 mL (0 mLs Intravenous Stopped 04/30/17 0152)  sodium chloride 0.9 % bolus 500 mL (0 mLs Intravenous Stopped 04/30/17 0152)  potassium chloride SA (K-DUR,KLOR-CON) CR tablet 40 mEq (40 mEq Oral Given 04/30/17 0150)  sodium chloride 0.9 % bolus 1,000 mL (0 mLs Intravenous Stopped 04/30/17 0238)     Initial Impression / Assessment and Plan / ED Course  I have reviewed the triage vital signs and the nursing notes.  Pertinent labs & imaging results that were available during my care of the patient were reviewed by me and considered in my medical decision making (see chart for details).    Patient was given IV fluids for probable dehydration. Chest x-ray was ordered.  Recheck at 01:55 AM patient is gotten 1500 mL IV fluid. He states he's only had urinary output once which was when he initially came to the ED. He was given another thousand cc of IV fluids. We went over his test results which were basically  normal except for mild hypokalemia. His blood pressure is 175/99. He is very certain that when he saw his doctor the past week twice that his blood pressure was normal. At this point I am not going to start him on blood pressure medication but he should  follow up with his doctor this coming week to see if his blood pressures remaining elevated.  Final Clinical Impressions(s) / ED Diagnoses   Final diagnoses:  Weakness  Dehydration  Hypokalemia    New Prescriptions Discharge Medication List as of 04/30/2017  2:50 AM    START taking these medications   Details  potassium chloride 20 MEQ TBCR Take 20 mEq by mouth 2 (two) times daily., Starting Sun 04/30/2017, Print       Plan discharge  Devoria AlbeIva Nethan Caudillo, MD, Concha PyoFACEP    Maysen Bonsignore, MD 04/30/17 [phone]

## 2017-04-30 NOTE — ED Notes (Signed)
Made Dr Lynelle DoctorKnapp aware of pt's BP.  No further orders at this time

## 2017-04-30 NOTE — ED Notes (Signed)
Pt states understanding of care given and follow up instructions.  Pt a/o ambulated from ED with steady gait 

## 2019-11-27 ENCOUNTER — Encounter: Payer: Self-pay | Admitting: Internal Medicine

## 2019-12-02 ENCOUNTER — Other Ambulatory Visit: Payer: Self-pay

## 2019-12-02 ENCOUNTER — Ambulatory Visit (AMBULATORY_SURGERY_CENTER): Payer: Self-pay

## 2019-12-02 VITALS — Temp 97.7°F | Ht 67.0 in | Wt 150.0 lb

## 2019-12-02 DIAGNOSIS — Z1211 Encounter for screening for malignant neoplasm of colon: Secondary | ICD-10-CM

## 2019-12-02 DIAGNOSIS — Z01818 Encounter for other preprocedural examination: Secondary | ICD-10-CM

## 2019-12-02 NOTE — Progress Notes (Signed)

## 2019-12-11 ENCOUNTER — Encounter: Payer: Self-pay | Admitting: Internal Medicine

## 2019-12-12 ENCOUNTER — Other Ambulatory Visit: Payer: Self-pay | Admitting: Internal Medicine

## 2019-12-12 ENCOUNTER — Other Ambulatory Visit: Payer: Self-pay

## 2019-12-12 ENCOUNTER — Ambulatory Visit (INDEPENDENT_AMBULATORY_CARE_PROVIDER_SITE_OTHER): Payer: PRIVATE HEALTH INSURANCE

## 2019-12-12 DIAGNOSIS — Z1159 Encounter for screening for other viral diseases: Secondary | ICD-10-CM

## 2019-12-13 LAB — SARS CORONAVIRUS 2 (TAT 6-24 HRS): SARS Coronavirus 2: NEGATIVE

## 2019-12-16 ENCOUNTER — Ambulatory Visit (AMBULATORY_SURGERY_CENTER): Payer: PRIVATE HEALTH INSURANCE | Admitting: Internal Medicine

## 2019-12-16 ENCOUNTER — Encounter: Payer: Self-pay | Admitting: Internal Medicine

## 2019-12-16 ENCOUNTER — Other Ambulatory Visit: Payer: Self-pay

## 2019-12-16 VITALS — BP 147/92 | HR 59 | Temp 97.5°F | Resp 22 | Ht 67.0 in | Wt 150.0 lb

## 2019-12-16 DIAGNOSIS — Z1211 Encounter for screening for malignant neoplasm of colon: Secondary | ICD-10-CM | POA: Diagnosis present

## 2019-12-16 MED ORDER — SODIUM CHLORIDE 0.9 % IV SOLN: 500.0000 mL | Freq: Once | INTRAVENOUS | Status: DC

## 2019-12-16 NOTE — Progress Notes (Signed)
Pt's states no medical or surgical changes since previsit or office visit.  JB - temp DT - vitals 

## 2019-12-16 NOTE — Patient Instructions (Signed)
Your test was normal.  You don't need another colonoscopy for 10 years.  YOU HAD AN ENDOSCOPIC PROCEDURE TODAY AT THE [facility]:   Refer to the procedure report that was given to you for any specific questions about what was found during the examination.  If the procedure report does not answer your questions, please call your gastroenterologist to clarify.  If you requested that your care partner not be given the details of your procedure findings, then the procedure report has been included in a sealed envelope for you to review at your convenience later.  YOU SHOULD EXPECT: Some feelings of bloating in the abdomen. Passage of more gas than usual.  Walking can help get rid of the air that was put into your GI tract during the procedure and reduce the bloating. If you had a lower endoscopy (such as a colonoscopy or flexible sigmoidoscopy) you may notice spotting of blood in your stool or on the toilet paper. If you underwent a bowel prep for your procedure, you may not have a normal bowel movement for a few days.  Please Note:  You might notice some irritation and congestion in your nose or some drainage.  This is from the oxygen used during your procedure.  There is no need for concern and it should clear up in a day or so.  SYMPTOMS TO REPORT IMMEDIATELY:   Following lower endoscopy (colonoscopy or flexible sigmoidoscopy):  Excessive amounts of blood in the stool  Significant tenderness or worsening of abdominal pains  Swelling of the abdomen that is new, acute  Fever of 100F or higher  For urgent or emergent issues, a gastroenterologist can be reached at any hour by calling [phone].   DIET:  We do recommend a small meal at first, but then you may proceed to your regular diet.  Drink plenty of fluids but you should avoid alcoholic beverages for 24 hours.  ACTIVITY:  You should plan to take it easy for the rest of today and you should NOT DRIVE or use heavy machinery  until tomorrow (because of the sedation medicines used during the test).    FOLLOW UP: Our staff will call the number listed on your records 48-72 hours following your procedure to check on you and address any questions or concerns that you may have regarding the information given to you following your procedure. If we do not reach you, we will leave a message.  We will attempt to reach you two times.  During this call, we will ask if you have developed any symptoms of COVID 19. If you develop any symptoms (ie: fever, flu-like symptoms, shortness of breath, cough etc.) before then, please call [phone].  If you test positive for Covid 19 in the 2 weeks post procedure, please call and report this information to [place].    If any biopsies were taken you will be contacted by phone or by letter within the next 1-3 weeks.  Please call us at [phone] if you have not heard about the biopsies in 3 weeks.    SIGNATURES/CONFIDENTIALITY: You and/or your care partner have signed paperwork which will be entered into your electronic medical record.  These signatures attest to the fact that that the information above on your After Visit Summary has been reviewed and is understood.  Full responsibility of the confidentiality of this discharge information lies with you and/or your care-partner.

## 2019-12-16 NOTE — Op Note (Signed)
[facility] Patient Name: Christian Lynch Procedure Date: 12/16/2019 8:25 AM MRN: [id number] Endoscopist: Iva Boop , MD Age: 49 Referring MD:  Date of Birth: [birth year] Gender: Male Account #: [id number] Procedure:                Colonoscopy Indications:              Screening for colorectal malignant neoplasm, This                            is the patient's first colonoscopy Medicines:                Propofol per Anesthesia, Monitored Anesthesia Care Procedure:                Pre-Anesthesia Assessment:                           - Prior to the procedure, a History and Physical                            was performed, and patient medications and                            allergies were reviewed. The patient's tolerance of                            previous anesthesia was also reviewed. The risks                            and benefits of the procedure and the sedation                            options and risks were discussed with the patient.                            All questions were answered, and informed consent                            was obtained. Prior Anticoagulants: The patient has                            taken no previous anticoagulant or antiplatelet                            agents. ASA Grade Assessment: II - A patient with                            mild systemic disease. After reviewing the risks                            and benefits, the patient was deemed in                            satisfactory condition to undergo the procedure.  After obtaining informed consent, the colonoscope                            was passed under direct vision. Throughout the                            procedure, the patient's blood pressure, pulse, and                            oxygen saturations were monitored continuously. The                            Colonoscope was introduced through the anus and   advanced to the the cecum, identified by                            appendiceal orifice and ileocecal valve. The                            colonoscopy was performed without difficulty. The                            patient tolerated the procedure well. The quality                            of the bowel preparation was excellent. The                            ileocecal valve, appendiceal orifice, and rectum                            were photographed. The bowel preparation used was                            Miralax via split dose instruction. Scope In: 8:40:07 AM Scope Out: 8:50:17 AM Scope Withdrawal Time: 0 hours 8 minutes 20 seconds  Total Procedure Duration: 0 hours 10 minutes 10 seconds  Findings:                 The perianal and digital rectal examinations were                            normal. Pertinent negatives include normal prostate                            (size, shape, and consistency).                           The entire examined colon appeared normal on direct                            and retroflexion views. Complications:            No immediate complications. Estimated Blood Loss:     Estimated blood loss: none. Impression:               -  The entire examined colon is normal on direct and                            retroflexion views. (were some inconsequential tiny                            hypertrophied anal papillae)                           - No specimens collected. Recommendation:           - Patient has a contact number available for                            emergencies. The signs and symptoms of potential                            delayed complications were discussed with the                            patient. Return to normal activities tomorrow.                            Written discharge instructions were provided to the                            patient.                           - Resume previous diet.                           - Continue  present medications.                           - Repeat colonoscopy/other appropriate test in 10                            years for screening purposes. Gatha Mayer, MD 12/16/2019 8:56:03 AM This report has been signed electronically.

## 2019-12-16 NOTE — Progress Notes (Signed)
PT taken to PACU. Monitors in place. VSS. Report given to RN. 

## 2019-12-17 ENCOUNTER — Telehealth: Payer: Self-pay

## 2019-12-17 NOTE — Telephone Encounter (Signed)
Colonoscopy report faxed to Theodosia Paling, NP at fax # [phone]. Phone # [phone].

## 2019-12-17 NOTE — Telephone Encounter (Signed)
-----   Message from Carl E Gessner, MD sent at 12/17/2019 12:46 PM EST ----- Regarding: please fax copy Please print and fax the colon report to Kimberly Millsapps, NP  [facility]  

## 2019-12-17 NOTE — Telephone Encounter (Signed)
-----   Message from Iva Boop, MD sent at 12/17/2019 12:46 PM EST ----- Regarding: please fax copy Please print and fax the colon report to Theodosia Paling, NP  [facility]

## 2019-12-18 ENCOUNTER — Telehealth: Payer: Self-pay | Admitting: *Deleted

## 2019-12-18 NOTE — Telephone Encounter (Signed)
1. Have you developed a fever since your procedure? no  2.   Have you had an respiratory symptoms (SOB or cough) since your procedure? no  3.   Have you tested positive for COVID 19 since your procedure no  4.   Have you had any family members/close contacts diagnosed with the COVID 19 since your procedure?  no   If yes to any of these questions please route to Laverna Peace, RN and Jennye Boroughs, Charity fundraiser.  Follow up Call-  Call back number 12/16/2019  Post procedure Call Back phone  # [phone]  Permission to leave phone message Yes  Some recent data might be hidden     Patient questions:  Do you have a fever, pain , or abdominal swelling? No. Pain Score  0 *  Have you tolerated food without any problems? Yes.    Have you been able to return to your normal activities? Yes.    Do you have any questions about your discharge instructions: Diet   No. Medications  No. Follow up visit  No.  Do you have questions or concerns about your Care? No.  Actions: * If pain score is 4 or above: No action needed, pain <4.

## 2019-12-18 NOTE — Telephone Encounter (Signed)
Left message on f/u call 

## 2020-11-10 ENCOUNTER — Ambulatory Visit: Payer: 59 | Admitting: Cardiology

## 2020-11-10 ENCOUNTER — Other Ambulatory Visit: Payer: Self-pay | Admitting: Cardiology

## 2020-11-10 ENCOUNTER — Encounter: Payer: Self-pay | Admitting: Cardiology

## 2020-11-10 ENCOUNTER — Other Ambulatory Visit: Payer: Self-pay

## 2020-11-10 VITALS — BP 135/89 | HR 81 | Ht 67.0 in | Wt 147.0 lb

## 2020-11-10 DIAGNOSIS — R072 Precordial pain: Secondary | ICD-10-CM

## 2020-11-10 DIAGNOSIS — E782 Mixed hyperlipidemia: Secondary | ICD-10-CM

## 2020-11-10 DIAGNOSIS — I10 Essential (primary) hypertension: Secondary | ICD-10-CM

## 2020-11-10 DIAGNOSIS — F1290 Cannabis use, unspecified, uncomplicated: Secondary | ICD-10-CM

## 2020-11-10 DIAGNOSIS — F17210 Nicotine dependence, cigarettes, uncomplicated: Secondary | ICD-10-CM

## 2020-11-10 NOTE — Progress Notes (Signed)
Date:  11/10/2020   ID:  [name], DOB January 17, 1971, MRN [id number]  PCP:  Rebecka Apley, NP  Cardiologist:  Tessa Lerner, DO, [facility] (established care November 10, 2020)  REASON FOR CONSULT: Chest Pain   REQUESTING PHYSICIAN:  Joette Catching, MD [address] [place],  [place] [address]  Chief Complaint  Patient presents with  . Chest Pain  . New Patient (Initial Visit)    HPI  Christian Lynch is a 50 y.o. male who presents to the office with a chief complaint of " chest pain." Patient's past medical history and cardiovascular risk factors include: Hypertension, hyperlipidemia, active smoking with at least 40-year pack history of smoking, marijuana smoking.  He is referred to the office at the request of Joette Catching, MD for evaluation of chest pain.  Patient is accompanied by his wife [name] at today's office visit.  Chest pain: Started approximately 6 months ago and intensity has remained stable over the last 6 months.  The pain is substernal, described as a pressure-like sensation, intensity 8 out of 10, radiates to the neck and bilateral shoulders, not brought on by effort related activities, does not resolve with rest.  The symptoms are usually random and self-limiting.  No improving or worsening factors.  Patient's wife states that he had a heart catheterization approximately 22 years ago and was told that he has a small blockage but no need for intervention.  No additional details are known at this time regarding that left heart catheterization  FUNCTIONAL STATUS: No structured exercise program or daily routine.   ALLERGIES: Allergies  Allergen Reactions  . Clarithromycin Other (See Comments)    Caused stomach bleeding  . Morphine And Related Nausea And Vomiting    hypertension  . Zofran [Ondansetron Hcl] Nausea And Vomiting    MEDICATION LIST PRIOR TO VISIT: Current Meds  Medication Sig  . ALPRAZolam (XANAX) 0.5 MG tablet alprazolam 0.5 mg tablet   TAKE ONE TABLET AT BEDTIME AS NEEDED  . levothyroxine (SYNTHROID) 175 MCG tablet Take 1 tablet by mouth daily.  Marland Kitchen losartan (COZAAR) 50 MG tablet Take 50 mg by mouth daily.  . meloxicam (MOBIC) 15 MG tablet meloxicam 15 mg tablet  . omeprazole (PRILOSEC) 20 MG capsule Take 20 mg by mouth at bedtime.   . pravastatin (PRAVACHOL) 80 MG tablet pravastatin 80 mg tablet  Take 1 tablet every day by oral route for 90 days.  . [DISCONTINUED] meloxicam (MOBIC) 7.5 MG tablet Take 15 mg by mouth at bedtime.      PAST MEDICAL HISTORY: Past Medical History:  Diagnosis Date  . Anxiety   . Arthritis   . Blood transfusion without reported diagnosis    as a newborn  . GERD (gastroesophageal reflux disease)   . Hypercholesterolemia   . Hypertension   . Reflux   . Thyroid disease     PAST SURGICAL HISTORY: Past Surgical History:  Procedure Laterality Date  . CARDIAC CATHETERIZATION     states no findings, in the 1990s  . WISDOM TOOTH EXTRACTION      FAMILY HISTORY: The patient family history includes Lung cancer in his mother; Pancreatic cancer in his father.  SOCIAL HISTORY:  The patient  reports that he has been smoking cigarettes. He has been smoking about 1.00 pack per day. He has never used smokeless tobacco. He reports current alcohol use. He reports current drug use. Drug: Marijuana.  REVIEW OF SYSTEMS: Review of Systems  Constitutional: Negative for chills and fever.  HENT: Negative for hoarse voice and nosebleeds.   Eyes: Negative for discharge, double vision and pain.  Cardiovascular: Positive for chest pain. Negative for claudication, dyspnea on exertion, leg swelling, near-syncope, orthopnea, palpitations, paroxysmal nocturnal dyspnea and syncope.  Respiratory: Positive for snoring. Negative for hemoptysis and shortness of breath.   Musculoskeletal: Negative for muscle cramps and myalgias.  Gastrointestinal: Negative for abdominal pain, constipation, diarrhea, hematemesis,  hematochezia, melena, nausea and vomiting.  Neurological: Negative for dizziness and light-headedness.    PHYSICAL EXAM: Vitals with BMI 11/10/2020 11/10/2020 12/16/2019  Height - 5\' 7"  -  Weight - 147 lbs -  BMI - 23.02 -  Systolic 135 152  Diastolic 89 98 92  Pulse 81 81 59    CONSTITUTIONAL: Well-developed and well-nourished. No acute distress.  SKIN: Skin is warm and dry. No rash noted. No cyanosis. No pallor. No jaundice HEAD: Normocephalic and atraumatic.  EYES: No scleral icterus MOUTH/THROAT: Moist oral membranes.  NECK: No JVD present. No thyromegaly noted. No carotid bruits  LYMPHATIC: No visible cervical adenopathy.  CHEST Normal respiratory effort. No intercostal retractions  LUNGS: Clear to auscultation bilaterally. No stridor. No wheezes. No rales.  CARDIOVASCULAR: Regular rate and rhythm, positive S1-S2, no murmurs rubs or gallops appreciated. ABDOMINAL: No apparent ascites.  EXTREMITIES: No peripheral edema  HEMATOLOGIC: No significant bruising NEUROLOGIC: Oriented to person, place, and time. Nonfocal. Normal muscle tone.  PSYCHIATRIC: Normal mood and affect. Normal behavior. Cooperative  CARDIAC DATABASE: EKG: 11/10/2020: Normal sinus rhythm, 70 bpm, LVH with voltage criteria, without underlying ischemia or injury pattern.    Echocardiogram: No results found for this or any previous visit from the past 1095 days.   Stress Testing: No results found for this or any previous visit from the past 1095 days.  Heart Catheterization: None  LABORATORY DATA: CBC Latest Ref Rng & Units 04/30/2017 12/27/2014 08/27/2010  WBC 4.0 - 10.5 K/uL 9.7 13.0(H) 12.8(H)  Hemoglobin 13.0 - 17.0 g/dL 08/29/2010 50.3 54.6  Hematocrit 39.0 - 52.0 % 42.5 43.9 37.9(L)  Platelets 150 - 400 K/uL 168 160 166    CMP Latest Ref Rng & Units 04/30/2017 12/27/2014 08/27/2010  Glucose 65 - 99 mg/dL 08/29/2010) 127(N) 170(Y)  BUN 6 - 20 mg/dL 13 15 8   Creatinine 0.61 - 1.24 mg/dL 174(B 4.49   Sodium 135 - 145 mmol/L 142 139 140  Potassium 3.5 - 5.1 mmol/L 3.3(L) 3.3(L) 3.8  Chloride 101 - 111 mmol/L 106 105 107  CO2 22 - 32 mmol/L 26 24 26   Calcium 8.9 - 10.3 mg/dL 8.9 9.0 8.8  Total Protein 6.0 - 8.3 g/dL - 7.6 -  Total Bilirubin 0.3 - 1.2 mg/dL - 0.6 -  Alkaline Phos 39 - 117 U/L - 57 -  AST 0 - 37 U/L - 24 -  ALT 0 - 53 U/L - 32 -    Lipid Panel  No results found for: CHOL, TRIG, HDL, CHOLHDL, VLDL, LDLCALC, LDLDIRECT, LABVLDL  No components found for: NTPROBNP No results for input(s): PROBNP in the last 8760 hours. No results for input(s): TSH in the last 8760 hours.  BMP No results for input(s): NA, K, CL, CO2, GLUCOSE, BUN, CREATININE, CALCIUM, GFRNONAA, GFRAA in the last 8760 hours.  HEMOGLOBIN A1C No results found for: HGBA1C, MPG   External Labs: Collected: 06/26/2020 TSH: 1.26  IMPRESSION:    ICD-10-CM   1. Precordial pain  R07.2 EKG 12-Lead    CT CARDIAC SCORING (SELF PAY ONLY)    PCV  ECHOCARDIOGRAM COMPLETE    PCV STRESS ECHOCARDIOGRAM    SARS-COV-2 RNA,(COVID-19) QUAL NAAT  2. Benign hypertension  I10   3. Mixed hyperlipidemia  E78.2   4. Cigarette smoker  F17.210   5. Marijuana smoker  F12.90      RECOMMENDATIONS: GOLDMAN BIRCHALL is a 50 y.o. male whose past medical history and cardiac risk factors include: Hypertension, hyperlipidemia, active smoking with at least 40-year pack history of smoking, marijuana smoking.  Precordial pain:  Patient symptoms of chest pain appears to be atypical in nature.  EKG shows normal sinus rhythm without underlying ischemia or injury pattern.  Echocardiogram will be ordered to evaluate for structural heart disease and left ventricular systolic function.  COVID screen prior to stress testing.  Patient will be scheduled for a stress echocardiogram to evaluate for exercise induced ischemia.  Educated on the importance of improving his modifiable cardiovascular risk factors including: Blood pressure,  lipids, and complete cessation of both nicotine and marijuana use.  Benign essential hypertension: . Office blood pressure is within acceptable range.  . Medication reconciled.  Marland Kitchen He is asked to keep a log of both blood pressure and pulse so that medications can be titrated based on a trend as opposed to isolated blood pressure readings in the office. . Low salt diet recommended. A diet that is rich in fruits, vegetables, legumes, and low-fat dairy products and low in snacks, sweets, and meats (such as the Dietary Approaches to Stop Hypertension [DASH] diet).   Hyperlipidemia, mixed:  Continue statin therapy.  Will obtain outside lab work performed by his PCP to evaluate his last lipid profile.  Active smoking:  At least 40-year pack history of smoking.  Currently smoking 1 packs/day    Patient was informed of the dangers of tobacco abuse including stroke, cancer, and MI, as well as benefits of tobacco cessation.  Patient is not willing to quit at this time.  Approximately 8 mins were spent counseling patient cessation techniques. We discussed various methods to help quit smoking, including deciding on a date to quit, joining a support group, pharmacological agents- nicotine gum/patch/lozenges, chantix.   I will reassess his progress at the next follow-up visit  Marijuana use: Educated on importance of complete cessation of marijuana use.  Independently reviewed the office note provided by his primary care provider as well as labs.  FINAL MEDICATION LIST END OF ENCOUNTER: No orders of the defined types were placed in this encounter.    Current Outpatient Medications:  .  ALPRAZolam (XANAX) 0.5 MG tablet, alprazolam 0.5 mg tablet  TAKE ONE TABLET AT BEDTIME AS NEEDED, Disp: , Rfl:  .  levothyroxine (SYNTHROID) 175 MCG tablet, Take 1 tablet by mouth daily., Disp: , Rfl:  .  losartan (COZAAR) 50 MG tablet, Take 50 mg by mouth daily., Disp: , Rfl:  .  meloxicam (MOBIC) 15 MG  tablet, meloxicam 15 mg tablet, Disp: , Rfl:  .  omeprazole (PRILOSEC) 20 MG capsule, Take 20 mg by mouth at bedtime. , Disp: , Rfl: 3 .  pravastatin (PRAVACHOL) 80 MG tablet, pravastatin 80 mg tablet  Take 1 tablet every day by oral route for 90 days., Disp: , Rfl:   Orders Placed This Encounter  Procedures  . SARS-COV-2 RNA,(COVID-19) QUAL NAAT  . CT CARDIAC SCORING (SELF PAY ONLY)  . EKG 12-Lead  . PCV ECHOCARDIOGRAM COMPLETE  . PCV STRESS ECHOCARDIOGRAM    There are no Patient Instructions on file for this visit.   --Continue cardiac  medications as reconciled in final medication list. --Return in about 4 weeks (around 12/08/2020) for Reevaluation of, Chest pain. Or sooner if needed. --Continue follow-up with your primary care physician regarding the management of your other chronic comorbid conditions.  Patient's questions and concerns were addressed to his satisfaction. He voices understanding of the instructions provided during this encounter.   This note was created using a voice recognition software as a result there may be grammatical errors inadvertently enclosed that do not reflect the nature of this encounter. Every attempt is made to correct such errors.  Tessa LernerSunit Arianna Delsanto, [place]DO, [facility]  Pager: [phone] Office: [phone]

## 2020-11-14 ENCOUNTER — Other Ambulatory Visit (HOSPITAL_COMMUNITY)
Admission: RE | Admit: 2020-11-14 | Discharge: 2020-11-14 | Disposition: A | Discharge status: Home / Self Care | Payer: 59 | Source: Ambulatory Visit | Attending: Cardiology | Admitting: Cardiology

## 2020-11-14 DIAGNOSIS — Z20822 Contact with and (suspected) exposure to covid-19: Secondary | ICD-10-CM | POA: Insufficient documentation

## 2020-11-14 DIAGNOSIS — Z01812 Encounter for preprocedural laboratory examination: Secondary | ICD-10-CM | POA: Diagnosis present

## 2020-11-14 LAB — SARS CORONAVIRUS 2 (TAT 6-24 HRS): SARS Coronavirus 2: NEGATIVE

## 2020-11-17 ENCOUNTER — Other Ambulatory Visit: Payer: 59

## 2020-11-26 ENCOUNTER — Ambulatory Visit: Payer: 59

## 2020-11-26 ENCOUNTER — Ambulatory Visit (HOSPITAL_COMMUNITY): Admission: RE | Admit: 2020-11-26 | Payer: PRIVATE HEALTH INSURANCE | Source: Ambulatory Visit

## 2020-11-26 ENCOUNTER — Other Ambulatory Visit: Payer: Self-pay

## 2020-11-26 DIAGNOSIS — R072 Precordial pain: Secondary | ICD-10-CM

## 2020-12-08 ENCOUNTER — Ambulatory Visit: Payer: 59 | Admitting: Cardiology

## 2020-12-17 ENCOUNTER — Ambulatory Visit: Payer: 59 | Admitting: Cardiology

## 2020-12-17 ENCOUNTER — Other Ambulatory Visit: Payer: Self-pay

## 2020-12-17 ENCOUNTER — Encounter: Payer: Self-pay | Admitting: Cardiology

## 2020-12-17 VITALS — BP 170/96 | HR 74 | Temp 98.6°F | Resp 16 | Ht 67.0 in | Wt 149.0 lb

## 2020-12-17 DIAGNOSIS — E782 Mixed hyperlipidemia: Secondary | ICD-10-CM

## 2020-12-17 DIAGNOSIS — I080 Rheumatic disorders of both mitral and aortic valves: Secondary | ICD-10-CM

## 2020-12-17 DIAGNOSIS — F17210 Nicotine dependence, cigarettes, uncomplicated: Secondary | ICD-10-CM

## 2020-12-17 DIAGNOSIS — R072 Precordial pain: Secondary | ICD-10-CM

## 2020-12-17 DIAGNOSIS — F1290 Cannabis use, unspecified, uncomplicated: Secondary | ICD-10-CM

## 2020-12-17 DIAGNOSIS — I10 Essential (primary) hypertension: Secondary | ICD-10-CM

## 2020-12-17 MED ORDER — AMLODIPINE BESYLATE 5 MG PO TABS: 5.0000 mg | ORAL_TABLET | Freq: Every morning | ORAL | 0 refills | Status: DC

## 2020-12-17 NOTE — Progress Notes (Signed)
Date:  12/17/2020   ID:  Christian Lynch, DOB 05/13/1971, MRN [id number]  PCP:  Christian Lynch  Cardiologist:  Christian Lynch, [facility] ([facility] care November 10, 2020)  Date: 12/17/20 Last Office Visit: 11/10/2020  Chief Complaint  Patient presents with  . Follow-up    4 weeks  . Results    Echo and stress test    HPI  Christian Lynch is a 50 y.o. male who presents to the office with a chief complaint of " re-evaluation of chest pain." Patient's past medical history and cardiovascular risk factors include: Hypertension, hyperlipidemia, active smoking with at least 40-year pack history of smoking, marijuana smoking.  He is referred to the office at the request of Christian Lynch for evaluation of chest pain.  Patient is accompanied by his wife Christian Lynch at today's office visit.  Patient is accompanied by his mother-in-law Christian Lynch.  Patient provides verbal consent in regards to discussing his information in the presence of his mother-in-law.  The last office visit patient's chest pain appeared to be possibly cardiac in origin given his multiple cardiovascular risk factors and ischemic evaluation was requested.  Patient did undergo an echocardiogram which noted preserved LVEF with mild valvular heart disease.  He has not undergone a stress test as recommended during the last encounter.  From a symptomatic standpoint patient states that he has some mild chest discomfort but not what it was before.  Patient's blood pressure is not well controlled at today's office visit that is most likely secondary to him not taking his pills for today.  However patient states that even when he takes his pills his systolic blood pressure usually greater than 150 mmHg.  Patient states that his blood pressures are currently managed by primary team but he has not had a chance to follow-up with him.  And his salt intake is high.  Patient states that his meals consist of either canned foods or  frozen foods.  Patient states that he continues to take aspirin 81 mg p.o. daily as directed per his wife.  FUNCTIONAL STATUS: No structured exercise program or daily routine.   ALLERGIES: Allergies  Allergen Reactions  . Clarithromycin Other (See Comments)    Caused stomach bleeding  . Morphine And Related Nausea And Vomiting    hypertension  . Zofran [Ondansetron Hcl] Nausea And Vomiting    MEDICATION LIST PRIOR TO VISIT: Current Meds  Medication Sig  . ALPRAZolam (XANAX) 0.5 MG tablet alprazolam 0.5 mg tablet  TAKE ONE TABLET AT BEDTIME AS NEEDED  . amLODipine (NORVASC) 5 MG tablet Take 1 tablet (5 mg total) by mouth in the morning.  Marland Kitchen. levothyroxine (SYNTHROID) 175 MCG tablet Take 1 tablet by mouth daily.  Marland Kitchen. losartan-hydrochlorothiazide (HYZAAR) 100-25 MG tablet Take 1 tablet by mouth daily.  . meloxicam (MOBIC) 15 MG tablet meloxicam 15 mg tablet  . omeprazole (PRILOSEC) 20 MG capsule Take 20 mg by mouth at bedtime.   . pravastatin (PRAVACHOL) 80 MG tablet pravastatin 80 mg tablet  Take 1 tablet every day by oral route for 90 days.     PAST MEDICAL HISTORY: Past Medical History:  Diagnosis Date  . Anxiety   . Arthritis   . Blood transfusion without reported diagnosis    as a newborn  . GERD (gastroesophageal reflux disease)   . Hypercholesterolemia   . Hypertension   . Reflux   . Thyroid disease     PAST SURGICAL HISTORY: Past Surgical History:  Procedure Laterality Date  . CARDIAC CATHETERIZATION     states no findings, in the 1990s  . WISDOM TOOTH EXTRACTION      FAMILY HISTORY: The patient family history includes Heart disease in his sister; Lung cancer in his mother; Pancreatic cancer in his father.  SOCIAL HISTORY:  The patient  reports that he has been smoking cigarettes. He has been smoking about 1.00 pack per day. He has never used smokeless tobacco. He reports current alcohol use. He reports current drug use. Drug: Marijuana.  REVIEW OF  SYSTEMS: Review of Systems  Constitutional: Negative for chills and fever.  HENT: Negative for hoarse voice and nosebleeds.   Eyes: Negative for discharge, double vision and pain.  Cardiovascular: Negative for chest pain, claudication, dyspnea on exertion, leg swelling, near-syncope, orthopnea, palpitations, paroxysmal nocturnal dyspnea and syncope.  Respiratory: Positive for snoring. Negative for hemoptysis and shortness of breath.   Musculoskeletal: Negative for muscle cramps and myalgias.  Gastrointestinal: Negative for abdominal pain, constipation, diarrhea, hematemesis, hematochezia, melena, nausea and vomiting.  Neurological: Negative for dizziness and light-headedness.    PHYSICAL EXAM: Vitals with BMI 12/17/2020 12/17/2020 11/10/2020  Height - 5\' 7"  -  Weight - 149 lbs -  BMI - 23.33 -  Systolic 170 168  Diastolic 96 96 89  Pulse 74 77 81    CONSTITUTIONAL: Well-developed and well-nourished. No acute distress.  SKIN: Skin is warm and dry. No rash noted. No cyanosis. No pallor. No jaundice HEAD: Normocephalic and atraumatic.  EYES: No scleral icterus MOUTH/THROAT: Moist oral membranes.  NECK: No JVD present. No thyromegaly noted. No carotid bruits  LYMPHATIC: No visible cervical adenopathy.  CHEST Normal respiratory effort. No intercostal retractions  LUNGS: Clear to auscultation bilaterally. No stridor. No wheezes. No rales.  CARDIOVASCULAR: Regular rate and rhythm, positive S1-S2, no murmurs rubs or gallops appreciated. ABDOMINAL: No apparent ascites.  EXTREMITIES: No peripheral edema  HEMATOLOGIC: No significant bruising NEUROLOGIC: Oriented to person, place, and time. Nonfocal. Normal muscle tone.  PSYCHIATRIC: Normal mood and affect. Normal behavior. Cooperative  CARDIAC DATABASE: EKG: 11/10/2020: Normal sinus rhythm, 70 bpm, LVH with voltage criteria, without underlying ischemia or injury pattern.    Echocardiogram: 11/26/2020: Normal LV systolic function with  visual EF 55-60%. Left ventricle cavity is normal in size. Normal global wall motion. Normal diastolic filling pattern, normal LAP. Mild (Grade I) aortic regurgitation. Mild (Grade I) mitral regurgitation. No prior study for comparison.  Stress Testing: No results found for this or any previous visit from the past 1095 days.  Heart Catheterization: None  LABORATORY DATA: CBC Latest Ref Rng & Units 04/30/2017 12/27/2014 08/27/2010  WBC 4.0 - 10.5 K/uL 9.7 13.0(H) 12.8(H)  Hemoglobin 13.0 - 17.0 g/dL 08/29/2010 40.9 73.5  Hematocrit 39.0 - 52.0 % 42.5 43.9 37.9(L)  Platelets 150 - 400 K/uL 168 160 166    CMP Latest Ref Rng & Units 04/30/2017 12/27/2014 08/27/2010  Glucose 65 - 99 mg/dL 08/29/2010) 924(Q) 683(M)  BUN 6 - 20 mg/dL 13 15 8   Creatinine 0.61 - 1.24 mg/dL 196(Q 2.29  Sodium 135 - 145 mmol/L 142 139 140  Potassium 3.5 - 5.1 mmol/L 3.3(L) 3.3(L) 3.8  Chloride 101 - 111 mmol/L 106 105 107  CO2 22 - 32 mmol/L 26 24 26   Calcium 8.9 - 10.3 mg/dL 8.9 9.0 8.8  Total Protein 6.0 - 8.3 g/dL - 7.6 -  Total Bilirubin 0.3 - 1.2 mg/dL - 0.6 -  Alkaline Phos 39 - 117 U/L - 57 -  AST 0 - 37 U/L - 24 -  ALT 0 - 53 U/L - 32 -    Lipid Panel  No results found for: CHOL, TRIG, HDL, CHOLHDL, VLDL, LDLCALC, LDLDIRECT, LABVLDL  No components found for: NTPROBNP No results for input(s): PROBNP in the last 8760 hours. No results for input(s): TSH in the last 8760 hours.  BMP No results for input(s): NA, K, CL, CO2, GLUCOSE, BUN, CREATININE, CALCIUM, GFRNONAA, GFRAA in the last 8760 hours.  HEMOGLOBIN A1C No results found for: HGBA1C, MPG   External Labs: Collected: 06/26/2020 TSH: 1.26  IMPRESSION:    ICD-10-CM   1. Precordial pain  R07.2 CT CARDIAC SCORING (SELF PAY ONLY)  2. Mild mitral and aortic regurgitation  I08.0   3. Benign hypertension  I10 amLODipine (NORVASC) 5 MG tablet  4. Mixed hyperlipidemia  E78.2   5. Cigarette smoker  F17.210   6. Marijuana smoker  F12.90       RECOMMENDATIONS: OREE HISLOP is a 50 y.o. male whose past medical history and cardiac risk factors include: Hypertension, hyperlipidemia, active smoking with at least 40-year pack history of smoking, marijuana smoking.  Precordial pain:  Reschedule stress echocardiogram.  Echocardiogram results reviewed as discussed above.  We will check a calcium score for further stratification.   Educated on the importance of improving his modifiable cardiovascular risk factors including: Blood pressure, lipids, and complete cessation of both nicotine and marijuana use.  Patient is requested to have a repeat fasting lipid profile and screen for diabetes when he gets blood work done with his PCP.  He verbalized understanding he is asked to send a copy of the blood work to the office for further review  Benign essential hypertension: . Office blood pressure is not within acceptable range.  Marland Kitchen Until he gets a chance to see his PCP we will start him on amlodipine 5 mg p.o. every morning.  Will defer additional medication titration to his PCP. Marland Kitchen Medication reconciled.  . Low salt diet recommended. A diet that is rich in fruits, vegetables, legumes, and low-fat dairy products and low in snacks, sweets, and meats (such as the Dietary Approaches to Stop Hypertension [DASH] diet).  Hyperlipidemia, mixed:  Continue statin therapy.  Will obtain outside lab work performed by his PCP to evaluate his last lipid profile.  Active smoking:  At least 40-year pack history of smoking.  Currently smoking 1/3 packs/day    Patient was informed of the dangers of tobacco abuse including stroke, cancer, and MI, as well as benefits of tobacco cessation.  Patient is not willing to quit at this time.  Approximately 5 mins were spent counseling patient cessation techniques. We discussed various methods to help quit smoking, including deciding on a date to quit, joining a support group, pharmacological agents-  nicotine gum/patch/lozenges, chantix.   I will reassess his progress at the next follow-up visit  Marijuana use: Educated on importance of complete cessation of marijuana use.  FINAL MEDICATION LIST END OF ENCOUNTER: Meds ordered this encounter  Medications  . amLODipine (NORVASC) 5 MG tablet    Sig: Take 1 tablet (5 mg total) by mouth in the morning.    Dispense:  30 tablet    Refill:  0     Current Outpatient Medications:  .  ALPRAZolam (XANAX) 0.5 MG tablet, alprazolam 0.5 mg tablet  TAKE ONE TABLET AT BEDTIME AS NEEDED, Disp: , Rfl:  .  amLODipine (NORVASC) 5 MG tablet, Take 1 tablet (5 mg total) by  mouth in the morning., Disp: 30 tablet, Rfl: 0 .  levothyroxine (SYNTHROID) 175 MCG tablet, Take 1 tablet by mouth daily., Disp: , Rfl:  .  losartan-hydrochlorothiazide (HYZAAR) 100-25 MG tablet, Take 1 tablet by mouth daily., Disp: , Rfl:  .  meloxicam (MOBIC) 15 MG tablet, meloxicam 15 mg tablet, Disp: , Rfl:  .  omeprazole (PRILOSEC) 20 MG capsule, Take 20 mg by mouth at bedtime. , Disp: , Rfl: 3 .  pravastatin (PRAVACHOL) 80 MG tablet, pravastatin 80 mg tablet  Take 1 tablet every day by oral route for 90 days., Disp: , Rfl:   Orders Placed This Encounter  Procedures  . CT CARDIAC SCORING (SELF PAY ONLY)    There are no Patient Instructions on file for this visit.   --Continue cardiac medications as reconciled in final medication list. --Return in about 3 months (around 03/16/2021) for Review test results. Or sooner if needed. --Continue follow-up with your primary care physician regarding the management of your other chronic comorbid conditions.  Patient's questions and concerns were addressed to his satisfaction. He voices understanding of the instructions provided during this encounter.   This note was created using a voice recognition software as a result there may be grammatical errors inadvertently enclosed that Lynch not reflect the nature of this encounter. Every attempt  is made to correct such errors.  Christian Lerner, [place], [facility]  Pager: [phone] Office: [phone]

## 2020-12-18 ENCOUNTER — Telehealth: Payer: Self-pay

## 2020-12-18 ENCOUNTER — Other Ambulatory Visit: Payer: Self-pay | Admitting: Cardiology

## 2020-12-18 DIAGNOSIS — I10 Essential (primary) hypertension: Secondary | ICD-10-CM

## 2020-12-18 NOTE — Telephone Encounter (Signed)
Please talk to Anabell to see if we can do it in the next week or so? Option two is see if [facility] has started doing them? Option three is plain GXT.  Please let me know.

## 2020-12-18 NOTE — Telephone Encounter (Signed)
Patient wife called about patient taking Losartan-HCTZ 100-25mg  that his PCP-PA had increased from Losartan-HCTZ 50-12.5. She stated that he had been "bottoming out" so they have cutting the new Losartan-Hctz 100-25mg  in half, but she is concerned it is still too strong. Dr. Odis Hollingshead prescribed Amlodipine 5mg  because patient had a blood pressure of 170/96 during his office visit. I advised patient, per Dr. , for patient to contact his PCP office regarding changing the Losartan-HCTZ 100-25mg . I also advised her to have patient take Amlodipine 5mg   Only if systolic BP is 140, per Dr. Odis Hollingshead.

## 2020-12-18 NOTE — Telephone Encounter (Signed)
Pt's wife, [name], called to schedule his stress test. His order is in as a stress echo. From my understanding we are not currently doing this type of test in office. Should this order be for a regular stress (gxt)? Please advise. Thanks

## 2020-12-29 ENCOUNTER — Other Ambulatory Visit: Payer: Self-pay | Admitting: Cardiology

## 2020-12-29 DIAGNOSIS — R072 Precordial pain: Secondary | ICD-10-CM

## 2021-01-06 ENCOUNTER — Ambulatory Visit: Payer: 59

## 2021-01-06 ENCOUNTER — Other Ambulatory Visit: Payer: Self-pay

## 2021-01-11 ENCOUNTER — Other Ambulatory Visit: Payer: Self-pay

## 2021-01-11 ENCOUNTER — Ambulatory Visit: Payer: 59

## 2021-01-20 NOTE — Progress Notes (Signed)
Called patient, NA, LMAM

## 2021-01-21 NOTE — Progress Notes (Signed)
Called and spoke with patient/wife regarding stress test results.

## 2021-03-09 ENCOUNTER — Ambulatory Visit
Admission: RE | Admit: 2021-03-09 | Discharge: 2021-03-09 | Disposition: A | Discharge status: Home / Self Care | Payer: No Typology Code available for payment source | Source: Ambulatory Visit | Attending: Cardiology | Admitting: Cardiology

## 2021-03-09 DIAGNOSIS — R072 Precordial pain: Secondary | ICD-10-CM

## 2021-03-10 NOTE — Progress Notes (Signed)
Spoke to patient's wife she voiced understanding

## 2021-03-23 NOTE — Progress Notes (Signed)
Date:  03/24/2021   ID:  DARRELL HAUK, DOB [birth year], MRN [id number]  PCP:  Rebecka Apley, NP  Cardiologist:  Tessa Lerner, DO, [facility] (established care November 10, 2020)  Date: 03/24/21 Last Office Visit: 12/17/2020  Chief Complaint  Patient presents with  . Follow-up    3 months   . Results    HPI  Christian Lynch is a 50 y.o. male who presents to the office with a chief complaint of "3 month follow for chest pain and review test results." Patient's past medical history and cardiovascular risk factors include: Hypertension, hyperlipidemia, mild coronary artery calcification, active smoking with at least 40-year pack history of smoking, marijuana smoking.  He is referred to the office at the request of Hemberg, Ruby Cola, NP for evaluation of chest pain.  Patient is accompanied by his mother-in-law [name].  Patient provides verbal consent in regards to discussing his information in the presence of his mother-in-law.  Since last office visit patient has not had any reoccurrence of chest pain.  He underwent stress test which was overall a low risk study and he has some mild coronary artery calcification.  Patient is already on aspirin and statin therapy.  No recent hospitalizations or urgent care visits for cardiovascular symptoms.  Patient states that his home blood pressures have improved with the medication changes that were recommended during prior office visits.  He still has not had a chance to follow-up with his PCP.  Patient states that he checks his blood pressure on a periodic basis and a systolic blood pressure ranges between 120-130 mmHg.  Compared to it being greater than 150 mmHg consistently in the past.  He is also made some dietary changes.  Unfortunately continues to smoke 1 pack/day.  FUNCTIONAL STATUS: No structured exercise program or daily routine.   ALLERGIES: Allergies  Allergen Reactions  . Clarithromycin Other (See Comments)    Caused stomach  bleeding  . Morphine And Related Nausea And Vomiting    hypertension  . Zofran [Ondansetron Hcl] Nausea And Vomiting    MEDICATION LIST PRIOR TO VISIT: Current Meds  Medication Sig  . ALPRAZolam (XANAX) 0.5 MG tablet alprazolam 0.5 mg tablet  TAKE ONE TABLET AT BEDTIME AS NEEDED  . amLODipine (NORVASC) 5 MG tablet TAKE ONE TABLET EVERY MORNING  . aspirin EC 81 MG tablet Take 81 mg by mouth daily. Swallow whole.  . levothyroxine (SYNTHROID) 175 MCG tablet Take 1 tablet by mouth daily.  Marland Kitchen losartan-hydrochlorothiazide (HYZAAR) 100-25 MG tablet Take 1 tablet by mouth daily.  . meloxicam (MOBIC) 15 MG tablet meloxicam 15 mg tablet  . omeprazole (PRILOSEC) 20 MG capsule Take 20 mg by mouth at bedtime.   . pravastatin (PRAVACHOL) 80 MG tablet pravastatin 80 mg tablet  Take 1 tablet every day by oral route for 90 days.     PAST MEDICAL HISTORY: Past Medical History:  Diagnosis Date  . Anxiety   . Arthritis   . Blood transfusion without reported diagnosis    as a newborn  . GERD (gastroesophageal reflux disease)   . Hypercholesterolemia   . Hypertension   . Reflux   . Thyroid disease     PAST SURGICAL HISTORY: Past Surgical History:  Procedure Laterality Date  . CARDIAC CATHETERIZATION     states no findings, in the 1990s  . WISDOM TOOTH EXTRACTION      FAMILY HISTORY: The patient family history includes Heart disease in his sister; Lung cancer in his mother; Pancreatic  cancer in his father.  SOCIAL HISTORY:  The patient  reports that he has been smoking cigarettes. He has been smoking about 1.00 pack per day. He has never used smokeless tobacco. He reports current alcohol use. He reports current drug use. Drug: Marijuana.  REVIEW OF SYSTEMS: Review of Systems  Constitutional: Negative for chills and fever.  HENT: Negative for hoarse voice and nosebleeds.   Eyes: Negative for discharge, double vision and pain.  Cardiovascular: Negative for chest pain, claudication,  dyspnea on exertion, leg swelling, near-syncope, orthopnea, palpitations, paroxysmal nocturnal dyspnea and syncope.  Respiratory: Negative for hemoptysis, shortness of breath and snoring.   Musculoskeletal: Negative for muscle cramps and myalgias.  Gastrointestinal: Negative for abdominal pain, constipation, diarrhea, hematemesis, hematochezia, melena, nausea and vomiting.  Neurological: Negative for dizziness and light-headedness.    PHYSICAL EXAM: Vitals with BMI 03/24/2021 12/17/2020 12/17/2020  Height 5\' 7"  - 5\' 7"   Weight 152 lbs 10 oz - 149 lbs  BMI 23.89 - 23.33  Systolic 132 170  Diastolic 78 96 96  Pulse 88 74 77    CONSTITUTIONAL: Well-developed and well-nourished. No acute distress.  SKIN: Skin is warm and dry. No rash noted. No cyanosis. No pallor. No jaundice HEAD: Normocephalic and atraumatic.  EYES: No scleral icterus MOUTH/THROAT: Moist oral membranes.  NECK: No JVD present. No thyromegaly noted. No carotid bruits  LYMPHATIC: No visible cervical adenopathy.  CHEST Normal respiratory effort. No intercostal retractions  LUNGS: Clear to auscultation bilaterally. No stridor. No wheezes. No rales.  CARDIOVASCULAR: Regular rate and rhythm, positive S1-S2, no murmurs rubs or gallops appreciated. ABDOMINAL: No apparent ascites.  EXTREMITIES: No peripheral edema  HEMATOLOGIC: No significant bruising NEUROLOGIC: Oriented to person, place, and time. Nonfocal. Normal muscle tone.  PSYCHIATRIC: Normal mood and affect. Normal behavior. Cooperative  CARDIAC DATABASE: EKG: 11/10/2020: Normal sinus rhythm, 70 bpm, LVH with voltage criteria, without underlying ischemia or injury pattern.    Echocardiogram: 11/26/2020: Normal LV systolic function with visual EF 55-60%. Left ventricle cavity is normal in size. Normal global wall motion. Normal diastolic filling pattern, normal LAP. Mild (Grade I) aortic regurgitation. Mild (Grade I) mitral regurgitation. No prior study for  comparison.  Stress Testing: 01/11/2021:  1 Day Rest/Stress Protocol. Patient exercised for 10 minutes and 29 seconds on Bruce protocol, achieved 12.58 METS, and 89% of age-predicted maximum heart rate. Stress ECG negative for ischemia. Overall normal myocardial perfusion, without convincing evidence of reversible myocardial ischemia or prior infarct. Left ventricular size: Dilated (EDV 149 cc). Left ventricular wall thickness is preserved without regional wall motion abnormalities. Calculated LVEF 41%. Low risk study.  CAC Score 03/09/2021: Total Agatston score: 7.8  [place] database percentile: 64 No acute or significant extracardiac abnormality.  Heart Catheterization: None  LABORATORY DATA: CBC Latest Ref Rng & Units 04/30/2017 12/27/2014 08/27/2010  WBC 4.0 - 10.5 K/uL 9.7 13.0(H) 12.8(H)  Hemoglobin 13.0 - 17.0 g/dL 12/29/2014 08/29/2010 22.0  Hematocrit 39.0 - 52.0 % 42.5 43.9 37.9(L)  Platelets 150 - 400 K/uL 168 160 166    CMP Latest Ref Rng & Units 04/30/2017 12/27/2014 08/27/2010  Glucose 65 - 99 mg/dL 12/29/2014) 08/29/2010) 623(J)  BUN 6 - 20 mg/dL 13 15 8   Creatinine 0.61 - 1.24 mg/dL 628(B 151(V  Sodium 135 - 145 mmol/L 142 139 140  Potassium 3.5 - 5.1 mmol/L 3.3(L) 3.3(L) 3.8  Chloride 101 - 111 mmol/L 106 105 107  CO2 22 - 32 mmol/L 26 24 26   Calcium 8.9 - 10.3 mg/dL  8.9 9.0 8.8  Total Protein 6.0 - 8.3 g/dL - 7.6 -  Total Bilirubin 0.3 - 1.2 mg/dL - 0.6 -  Alkaline Phos 39 - 117 U/L - 57 -  AST 0 - 37 U/L - 24 -  ALT 0 - 53 U/L - 32 -    Lipid Panel  No results found for: CHOL, TRIG, HDL, CHOLHDL, VLDL, LDLCALC, LDLDIRECT, LABVLDL  No components found for: NTPROBNP No results for input(s): PROBNP in the last 8760 hours. No results for input(s): TSH in the last 8760 hours.  BMP No results for input(s): NA, K, CL, CO2, GLUCOSE, BUN, CREATININE, CALCIUM, GFRNONAA, GFRAA in the last 8760 hours.  HEMOGLOBIN A1C No results found for: HGBA1C, MPG   External Labs: Collected:  06/26/2020 TSH: 1.26  IMPRESSION:    ICD-10-CM   1. Precordial pain  R07.2   2. Agatston coronary artery calcium score less than 100  R93.1   3. Mild mitral and aortic regurgitation  I08.0   4. Benign hypertension  I10   5. Mixed hyperlipidemia  E78.2   6. Cigarette smoker  F17.210   7. Marijuana smoker  F12.90      RECOMMENDATIONS: Christian Lynch is a 50 y.o. male whose past medical history and cardiac risk factors include: Hypertension, hyperlipidemia, mild coronary artery calcification, active smoking with at least 40-year pack history of smoking, marijuana smoking.  Precordial pain: Resolved  Echocardiogram, stress test, coronary calcium score results reviewed with him at today's office visit.  Symptomatically has not had any reoccurrence since last office visit.  Educated on the importance of improving his modifiable cardiovascular risk factors including: Blood pressure, lipids, and complete cessation of both nicotine and marijuana use.  With regards to risk stratification he is encouraged to have a screening hemoglobin A1c checked with his PCP.   Benign essential hypertension: . Office blood pressure within acceptable range. . Currently managed by primary care provider. . Medication reconciled.  . Low salt diet recommended. A diet that is rich in fruits, vegetables, legumes, and low-fat dairy products and low in snacks, sweets, and meats (such as the Dietary Approaches to Stop Hypertension [DASH] diet).  Hyperlipidemia, mixed:  Continue statin therapy.  Will obtain outside lab work performed by his PCP to evaluate his last lipid profile.  Active smoking:  At least 40-year pack history of smoking.  Currently smoking 1 packs/day    Patient was informed of the dangers of tobacco abuse including stroke, cancer, and MI, as well as benefits of tobacco cessation.  Patient is not willing to quit at this time.  Approximately 5 mins were spent counseling patient  cessation techniques. We discussed various methods to help quit smoking, including deciding on a date to quit, joining a support group, pharmacological agents- nicotine gum/patch/lozenges, chantix.   I will reassess his progress at the next follow-up visit  Marijuana use: Educated on importance of complete cessation of marijuana use.  FINAL MEDICATION LIST END OF ENCOUNTER: No orders of the defined types were placed in this encounter.    Current Outpatient Medications:  .  ALPRAZolam (XANAX) 0.5 MG tablet, alprazolam 0.5 mg tablet  TAKE ONE TABLET AT BEDTIME AS NEEDED, Disp: , Rfl:  .  amLODipine (NORVASC) 5 MG tablet, TAKE ONE TABLET EVERY MORNING, Disp: 30 tablet, Rfl: 0 .  aspirin EC 81 MG tablet, Take 81 mg by mouth daily. Swallow whole., Disp: , Rfl:  .  levothyroxine (SYNTHROID) 175 MCG tablet, Take 1 tablet by mouth daily.,  Disp: , Rfl:  .  losartan-hydrochlorothiazide (HYZAAR) 100-25 MG tablet, Take 1 tablet by mouth daily., Disp: , Rfl:  .  meloxicam (MOBIC) 15 MG tablet, meloxicam 15 mg tablet, Disp: , Rfl:  .  omeprazole (PRILOSEC) 20 MG capsule, Take 20 mg by mouth at bedtime. , Disp: , Rfl: 3 .  pravastatin (PRAVACHOL) 80 MG tablet, pravastatin 80 mg tablet  Take 1 tablet every day by oral route for 90 days., Disp: , Rfl:   No orders of the defined types were placed in this encounter.   There are no Patient Instructions on file for this visit.   --Continue cardiac medications as reconciled in final medication list. --Return in about 1 year (around 03/24/2022) for Follow up, Coronary artery calcification, cardiac risk factors. . Or sooner if needed. --Continue follow-up with your primary care physician regarding the management of your other chronic comorbid conditions.  Patient's questions and concerns were addressed to his satisfaction. He voices understanding of the instructions provided during this encounter.   This note was created using a voice recognition software as a  result there may be grammatical errors inadvertently enclosed that do not reflect the nature of this encounter. Every attempt is made to correct such errors.  Tessa Lerner, [place], [facility]  Pager: [phone] Office: [phone]

## 2021-03-24 ENCOUNTER — Ambulatory Visit: Payer: 59 | Admitting: Cardiology

## 2021-03-24 ENCOUNTER — Other Ambulatory Visit: Payer: Self-pay

## 2021-03-24 ENCOUNTER — Encounter: Payer: Self-pay | Admitting: Cardiology

## 2021-03-24 VITALS — BP 132/78 | HR 88 | Temp 97.5°F | Resp 16 | Ht 67.0 in | Wt 152.6 lb

## 2021-03-24 DIAGNOSIS — I080 Rheumatic disorders of both mitral and aortic valves: Secondary | ICD-10-CM

## 2021-03-24 DIAGNOSIS — I10 Essential (primary) hypertension: Secondary | ICD-10-CM

## 2021-03-24 DIAGNOSIS — F1290 Cannabis use, unspecified, uncomplicated: Secondary | ICD-10-CM

## 2021-03-24 DIAGNOSIS — F17210 Nicotine dependence, cigarettes, uncomplicated: Secondary | ICD-10-CM

## 2021-03-24 DIAGNOSIS — R931 Abnormal findings on diagnostic imaging of heart and coronary circulation: Secondary | ICD-10-CM

## 2021-03-24 DIAGNOSIS — E782 Mixed hyperlipidemia: Secondary | ICD-10-CM

## 2021-03-24 DIAGNOSIS — R072 Precordial pain: Secondary | ICD-10-CM

## 2021-08-30 ENCOUNTER — Other Ambulatory Visit: Payer: Self-pay

## 2021-08-30 ENCOUNTER — Ambulatory Visit: Payer: 59 | Admitting: Cardiology

## 2021-08-30 ENCOUNTER — Encounter: Payer: Self-pay | Admitting: Cardiology

## 2021-08-30 ENCOUNTER — Observation Stay (HOSPITAL_COMMUNITY)
Admission: RE | Admit: 2021-08-30 | Discharge: 2021-08-31 | Disposition: A | Discharge status: Home / Self Care | Payer: 59 | Source: Other Acute Inpatient Hospital | Attending: Cardiology | Admitting: Cardiology

## 2021-08-30 VITALS — BP 125/87 | HR 95 | Resp 16 | Ht 67.0 in | Wt 147.4 lb

## 2021-08-30 DIAGNOSIS — I200 Unstable angina: Secondary | ICD-10-CM | POA: Diagnosis present

## 2021-08-30 DIAGNOSIS — R931 Abnormal findings on diagnostic imaging of heart and coronary circulation: Secondary | ICD-10-CM

## 2021-08-30 DIAGNOSIS — Z79899 Other long term (current) drug therapy: Secondary | ICD-10-CM | POA: Insufficient documentation

## 2021-08-30 DIAGNOSIS — I080 Rheumatic disorders of both mitral and aortic valves: Secondary | ICD-10-CM

## 2021-08-30 DIAGNOSIS — R072 Precordial pain: Secondary | ICD-10-CM | POA: Diagnosis not present

## 2021-08-30 DIAGNOSIS — E782 Mixed hyperlipidemia: Secondary | ICD-10-CM | POA: Diagnosis present

## 2021-08-30 DIAGNOSIS — F17210 Nicotine dependence, cigarettes, uncomplicated: Secondary | ICD-10-CM | POA: Diagnosis present

## 2021-08-30 DIAGNOSIS — I2510 Atherosclerotic heart disease of native coronary artery without angina pectoris: Secondary | ICD-10-CM | POA: Diagnosis present

## 2021-08-30 DIAGNOSIS — F1210 Cannabis abuse, uncomplicated: Secondary | ICD-10-CM | POA: Diagnosis present

## 2021-08-30 DIAGNOSIS — I10 Essential (primary) hypertension: Secondary | ICD-10-CM

## 2021-08-30 DIAGNOSIS — I2584 Coronary atherosclerosis due to calcified coronary lesion: Secondary | ICD-10-CM | POA: Diagnosis present

## 2021-08-30 DIAGNOSIS — R9431 Abnormal electrocardiogram [ECG] [EKG]: Secondary | ICD-10-CM

## 2021-08-30 DIAGNOSIS — U071 COVID-19: Secondary | ICD-10-CM | POA: Insufficient documentation

## 2021-08-30 DIAGNOSIS — F1290 Cannabis use, unspecified, uncomplicated: Secondary | ICD-10-CM

## 2021-08-30 LAB — COMPREHENSIVE METABOLIC PANEL
ALT: 30 U/L (ref 0–44)
AST: 50 U/L — ABNORMAL HIGH (ref 15–41)
Albumin: 3.5 g/dL (ref 3.5–5.0)
Alkaline Phosphatase: 39 U/L (ref 38–126)
Anion gap: 10 (ref 5–15)
BUN: 10 mg/dL (ref 6–20)
CO2: 24 mmol/L (ref 22–32)
Calcium: 8.5 mg/dL — ABNORMAL LOW (ref 8.9–10.3)
Chloride: 100 mmol/L (ref 98–111)
Creatinine, Ser: 0.76 mg/dL (ref 0.61–1.24)
GFR, Estimated: >60 mL/min (ref >60)
Glucose, Bld: 98 mg/dL (ref 70–99)
Potassium: 2.7 mmol/L — CL (ref 3.5–5.1)
Sodium: 134 mmol/L — ABNORMAL LOW (ref 135–145)
Total Bilirubin: 0.7 mg/dL (ref 0.3–1.2)
Total Protein: 6 g/dL — ABNORMAL LOW (ref 6.5–8.1)

## 2021-08-30 LAB — CBC WITH DIFFERENTIAL/PLATELET
Abs Immature Granulocytes: 0.01 10*3/uL (ref 0.00–0.07)
Basophils Absolute: 0 10*3/uL (ref 0.0–0.1)
Basophils Relative: 0 %
Eosinophils Absolute: 0 10*3/uL (ref 0.0–0.5)
Eosinophils Relative: 0 %
HCT: 41.2 % (ref 39.0–52.0)
Hemoglobin: 14.4 g/dL (ref 13.0–17.0)
Immature Granulocytes: 0 %
Lymphocytes Relative: 35 %
Lymphs Abs: 1.5 10*3/uL (ref 0.7–4.0)
MCH: 32.8 pg (ref 26.0–34.0)
MCHC: 35 g/dL (ref 30.0–36.0)
MCV: 93.8 fL (ref 80.0–100.0)
Monocytes Absolute: 0.4 10*3/uL (ref 0.1–1.0)
Monocytes Relative: 9 %
Neutro Abs: 2.3 10*3/uL (ref 1.7–7.7)
Neutrophils Relative %: 56 %
Platelets: 85 10*3/uL — ABNORMAL LOW (ref 150–400)
RBC: 4.39 MIL/uL (ref 4.22–5.81)
RDW: 12 % (ref 11.5–15.5)
WBC: 4.3 10*3/uL (ref 4.0–10.5)
nRBC: 0 % (ref 0.0–0.2)

## 2021-08-30 LAB — HEMOGLOBIN A1C
Hgb A1c MFr Bld: 5.4 % (ref 4.8–5.6)
Mean Plasma Glucose: 108.28 mg/dL

## 2021-08-30 LAB — MAGNESIUM: Magnesium: 2 mg/dL (ref 1.7–2.4)

## 2021-08-30 LAB — TSH: TSH: 3.081 u[IU]/mL (ref 0.350–4.500)

## 2021-08-30 LAB — HIV ANTIBODY (ROUTINE TESTING W REFLEX): HIV Screen 4th Generation wRfx: NONREACTIVE

## 2021-08-30 LAB — TROPONIN I (HIGH SENSITIVITY): Troponin I (High Sensitivity): 10 ng/L (ref <18)

## 2021-08-30 MED ORDER — ATORVASTATIN CALCIUM 80 MG PO TABS
80.0000 mg | ORAL_TABLET | Freq: Every day | ORAL | Status: DC
  Administered 2021-08-30: 80 mg via ORAL
  Filled 2021-08-30: qty 1

## 2021-08-30 MED ORDER — NITROGLYCERIN 0.4 MG SL SUBL: 0.4000 mg | SUBLINGUAL_TABLET | SUBLINGUAL | Status: DC | PRN

## 2021-08-30 MED ORDER — LOSARTAN POTASSIUM 50 MG PO TABS
100.0000 mg | ORAL_TABLET | Freq: Every day | ORAL | Status: DC
  Administered 2021-08-31: 100 mg via ORAL
  Filled 2021-08-30: qty 2

## 2021-08-30 MED ORDER — HEPARIN (PORCINE) 25000 UT/250ML-% IV SOLN
800.0000 [IU]/h | INTRAVENOUS | Status: DC
  Administered 2021-08-30: 800 [IU]/h via INTRAVENOUS
  Filled 2021-08-30: qty 250

## 2021-08-30 MED ORDER — ASPIRIN 81 MG PO CHEW
324.0000 mg | CHEWABLE_TABLET | ORAL | Status: AC
  Administered 2021-08-30: 324 mg via ORAL
  Filled 2021-08-30: qty 4

## 2021-08-30 MED ORDER — POTASSIUM CHLORIDE IN NACL 20-0.9 MEQ/L-% IV SOLN
INTRAVENOUS | Status: DC
  Filled 2021-08-30 (×2): qty 1000

## 2021-08-30 MED ORDER — LEVOTHYROXINE SODIUM 75 MCG PO TABS
175.0000 ug | ORAL_TABLET | Freq: Every day | ORAL | Status: DC
  Administered 2021-08-31: 175 ug via ORAL
  Filled 2021-08-30: qty 1

## 2021-08-30 MED ORDER — POTASSIUM CHLORIDE ER 10 MEQ PO TBCR
40.0000 meq | EXTENDED_RELEASE_TABLET | ORAL | Status: AC
  Administered 2021-08-30 – 2021-08-31 (×3): 40 meq via ORAL
  Filled 2021-08-30 (×6): qty 4

## 2021-08-30 MED ORDER — HYDROCHLOROTHIAZIDE 25 MG PO TABS
25.0000 mg | ORAL_TABLET | Freq: Every day | ORAL | Status: DC
  Administered 2021-08-31: 25 mg via ORAL
  Filled 2021-08-30: qty 1

## 2021-08-30 MED ORDER — ASPIRIN EC 81 MG PO TBEC: 81.0000 mg | DELAYED_RELEASE_TABLET | Freq: Every day | ORAL | Status: DC

## 2021-08-30 MED ORDER — METOPROLOL TARTRATE 25 MG PO TABS
25.0000 mg | ORAL_TABLET | Freq: Two times a day (BID) | ORAL | Status: DC
  Administered 2021-08-30 – 2021-08-31 (×2): 25 mg via ORAL
  Filled 2021-08-30 (×2): qty 1

## 2021-08-30 MED ORDER — ATORVASTATIN CALCIUM 80 MG PO TABS: 80.0000 mg | ORAL_TABLET | Freq: Every day | ORAL | Status: DC

## 2021-08-30 MED ORDER — ASPIRIN 300 MG RE SUPP
300.0000 mg | RECTAL | Status: AC
  Filled 2021-08-30: qty 1

## 2021-08-30 MED ORDER — PANTOPRAZOLE SODIUM 40 MG PO TBEC
40.0000 mg | DELAYED_RELEASE_TABLET | Freq: Every day | ORAL | Status: DC
  Administered 2021-08-31: 40 mg via ORAL
  Filled 2021-08-30: qty 1

## 2021-08-30 MED ORDER — AMLODIPINE BESYLATE 5 MG PO TABS
5.0000 mg | ORAL_TABLET | Freq: Every morning | ORAL | Status: DC
  Administered 2021-08-31: 5 mg via ORAL
  Filled 2021-08-30: qty 1

## 2021-08-30 MED ORDER — LOSARTAN POTASSIUM-HCTZ 100-25 MG PO TABS: 1.0000 | ORAL_TABLET | Freq: Every day | ORAL | Status: DC

## 2021-08-30 MED ORDER — ACETAMINOPHEN 325 MG PO TABS: 650.0000 mg | ORAL_TABLET | ORAL | Status: DC | PRN

## 2021-08-30 MED ORDER — NITROGLYCERIN IN D5W 200-5 MCG/ML-% IV SOLN
0.0000 ug/min | INTRAVENOUS | Status: DC
Start: 2021-08-30 — End: 2021-09-01
  Administered 2021-08-30: 5 ug/min via INTRAVENOUS
  Filled 2021-08-30: qty 250

## 2021-08-30 MED ORDER — HEPARIN BOLUS VIA INFUSION
4000.0000 [IU] | Freq: Once | INTRAVENOUS | Status: AC
  Administered 2021-08-30: 4000 [IU] via INTRAVENOUS
  Filled 2021-08-30: qty 4000

## 2021-08-30 MED ORDER — ALPRAZOLAM 0.5 MG PO TABS
0.5000 mg | ORAL_TABLET | Freq: Every day | ORAL | Status: DC
  Administered 2021-08-30: 0.5 mg via ORAL
  Filled 2021-08-30: qty 1

## 2021-08-30 NOTE — H&P (Addendum)
HISTORY AND PHYSICAL  Patient ID: Christian Lynch MRN: [id number] DOB/AGE: 07-20-1971 50 y.o.  Admit date: 08/30/2021 Attending physician: Tessa Lerner, DO Primary Physician:  Christian Apley, NP  Chief complaint: Chest pain  HPI:  Christian Lynch is a 50 y.o. male who presents with a chief complaint of " chest pain." His past medical history and cardiovascular risk factors include: Hypertension, hyperlipidemia, mild coronary artery calcification, active smoking with at least 40-year pack history of smoking, marijuana smoking.  Patient's wife called earlier this morning and stating that Christian Lynch is having chest pain and would like to be seen in the office.   Patient states that he is been having intermittent chest discomfort for about 1 week over the left anterior precordium, lasting for a few seconds, not able to comment on if it is effort related as he is not very physically active, and self-limited.   However, earlier today while at work at approximately 11 AM patient states that he was sitting down for lunch while drinking Dr. Reino Kent had anterior precordial chest pain across his chest, intensity 6 out of 10, sudden onset of diaphoresis, the pain is nonradiating, nonexertional.  The symptoms lasted about 20 minutes.  He continues to have residual throat pain.   He denies any active chest pain.   Unfortunately, he continues to smoke 1 pack/day despite being educated during multiple office encounters the importance of complete smoking cessation.  He also smokes marijuana last earlier this morning.   ALLERGIES: Allergies  Allergen Reactions   Clarithromycin Other (See Comments)    Caused stomach bleeding   Morphine And Related Nausea And Vomiting    hypertension   Zofran [Ondansetron Hcl] Nausea And Vomiting   Hydrocodone Rash   Oxycodone Rash    PAST MEDICAL HISTORY: Past Medical History:  Diagnosis Date   Anxiety    Arthritis    Blood transfusion without reported  diagnosis    as a newborn   GERD (gastroesophageal reflux disease)    Hypercholesterolemia    Hypertension    Reflux    Thyroid disease     PAST SURGICAL HISTORY: Past Surgical History:  Procedure Laterality Date   CARDIAC CATHETERIZATION     states no findings, in the 74s   WISDOM TOOTH EXTRACTION      FAMILY HISTORY: The patient family history includes Heart disease in his sister; Lung cancer in his mother; Pancreatic cancer in his father.   SOCIAL HISTORY:  The patient  reports that he has been smoking cigarettes. He has been smoking an average of 1 pack per day. He has never used smokeless tobacco. He reports current alcohol use. He reports current drug use. Drug: Marijuana.  MEDICATIONS: Current Outpatient Medications  Medication Instructions   ALPRAZolam (XANAX) 0.5 mg, Oral, Daily at bedtime   amLODipine (NORVASC) 5 MG tablet TAKE ONE TABLET EVERY MORNING   levothyroxine (SYNTHROID) 175 mcg, Oral, Daily   losartan-hydrochlorothiazide (HYZAAR) 100-25 MG tablet 1 tablet, Oral, Daily   meloxicam (MOBIC) 15 mg, Oral, Daily   omeprazole (PRILOSEC) 20 mg, Oral, Daily   pravastatin (PRAVACHOL) 80 mg, Oral, Daily   REVIEW OF SYSTEMS: Review of Systems  Constitutional: Negative for chills and fever.  HENT:  Negative for hoarse voice and nosebleeds.   Eyes:  Negative for discharge, double vision and pain.  Cardiovascular:  Positive for chest pain. Negative for claudication, dyspnea on exertion, leg swelling, near-syncope, orthopnea, palpitations, paroxysmal nocturnal dyspnea and syncope.  Respiratory:  Negative for hemoptysis  and shortness of breath.   Musculoskeletal:  Negative for muscle cramps and myalgias.  Gastrointestinal:  Negative for abdominal pain, constipation, diarrhea, hematemesis, hematochezia, melena, nausea and vomiting.  Neurological:  Negative for dizziness and light-headedness.  All other systems reviewed and are negative.  PHYSICAL EXAM: Vitals with  BMI 08/30/2021 08/30/2021 08/30/2021  Height - - -  Weight - - -  BMI - - -  Systolic 143 142 092  Diastolic 97 93 87  Pulse - 88 95    No intake or output data in the 24 hours ending 08/30/21 2042  Net IO Since Admission: No IO data has been entered for this period [08/30/21 2042] CONSTITUTIONAL: Well-developed and well-nourished. No acute distress.  SKIN: Skin is warm and dry. No rash noted. No cyanosis. No pallor. No jaundice HEAD: Normocephalic and atraumatic.  EYES: No scleral icterus MOUTH/THROAT: Moist oral membranes.  NECK: No JVD present. No thyromegaly noted. No carotid bruits  LYMPHATIC: No visible cervical adenopathy.  CHEST Normal respiratory effort. No intercostal retractions  LUNGS: Clear to auscultation bilaterally.  No stridor. No wheezes. No rales.  CARDIOVASCULAR: Regular rate and rhythm, positive S1-S2, no murmurs rubs or gallops appreciated ABDOMINAL: Soft, nontender, nondistended, positive bowel sounds in all 4 quadrants, no apparent ascites.  EXTREMITIES: No peripheral edema  HEMATOLOGIC: No significant bruising NEUROLOGIC: Oriented to person, place, and time. Nonfocal. Normal muscle tone.  PSYCHIATRIC: Normal mood and affect. Normal behavior. Cooperative  RADIOLOGY: No results found.  LABORATORY DATA: Lab Results  Component Value Date   WBC 4.3 08/30/2021   HGB 14.4 08/30/2021   HCT 41.2 08/30/2021   MCV 93.8 08/30/2021   PLT 85 (L) 08/30/2021    Recent Labs  Lab 08/30/21 1812  NA 134*  K 2.7*  CL 100  CO2 24  BUN 10  CREATININE 0.76  CALCIUM 8.5*  PROT 6.0*  BILITOT 0.7  ALKPHOS 39  ALT 30  AST 50*  GLUCOSE 98    Lipid Panel  No results found for: CHOL, HDL, LDLCALC, LDLDIRECT, TRIG, CHOLHDL  BNP (last 3 results) No results for input(s): BNP in the last 8760 hours.  HEMOGLOBIN A1C Lab Results  Component Value Date   HGBA1C 5.4 08/30/2021   MPG 108.28 08/30/2021    Cardiac Panel (last 3 results) Recent Labs     08/30/21 1812  TROPONINIHS 10    Lab Results  Component Value Date   CKTOTAL 104 04/30/2017     TSH Recent Labs    08/30/21 1812  TSH 3.081      CARDIAC DATABASE: EKG: 08/30/2021: Normal sinus rhythm, 86 bpm, LVH per voltage criteria, downsloping ST depression in the lateral leads concerning for ischemia, without underlying injury pattern.    Echocardiogram: 11/26/2020: Normal LV systolic function with visual EF 55-60%. Left ventricle cavity is normal in size. Normal global wall motion. Normal diastolic filling pattern, normal LAP. Mild (Grade I) aortic regurgitation. Mild (Grade I) mitral regurgitation. No prior study for comparison.   Stress Testing: 01/11/2021:  1 Day Rest/Stress Protocol. Patient exercised for 10 minutes and 29 seconds on Bruce protocol, achieved 12.58 METS, and 89% of age-predicted maximum heart rate. Stress ECG negative for ischemia. Overall normal myocardial perfusion, without convincing evidence of reversible myocardial ischemia or prior infarct. Left ventricular size: Dilated (EDV 149 cc). Left ventricular wall thickness is preserved without regional wall motion abnormalities. Calculated LVEF 41%. Low risk study.  CAC Score 03/09/2021: Total Agatston score: 7.8  [place] database percentile: 64 No acute  or significant extracardiac abnormality.   Heart Catheterization: None  IMPRESSION & RECOMMENDATIONS: RAYLIN WINER is a 50 y.o. Caucasian male whose past medical history and cardiovascular risk factors include: Hypertension, hyperlipidemia, mild coronary artery calcification, active smoking with at least 40-year pack history of smoking, marijuana smoking.  Impression: Unstable angina. Abnormal EKG Mild coronary artery calcification Cigarette smoker. Marijuana smoker. Hypertension. Hyperlipidemia  Plan: Patient presents to the office for sick visit given his new onset of chest pain.  Patient's characteristics of chest pain highly  suggestive of cardiac etiology, EKG in the office notes sinus rhythm with downsloping ST depressions in the lateral leads, and a POC echo done in the office noted preserved EF no significant RWMA.  Given the patient's presentation, risk factors, and work-up in the office patient was recommended to go to the ED for further evaluation and management.  Check CMP, CBC, trend troponins, BMP  Check EKG  IV heparin drip per ACS protocol  IV nitro drip for chest pain titration  Echocardiogram will be ordered to evaluate for structural heart disease and left ventricular systolic function.  N.p.o. after midnight  Resume home medications  Tentatively scheduled for left heart catheterization with possible intervention for tomorrow.  Consultants: None   Code Status: Full   Family Communication: Wife   Disposition Plan: Home   Time spent: 88 mins  Patient's questions and concerns were addressed to his satisfaction. He voices understanding of the instructions provided during this encounter.   This note was created using a voice recognition software as a result there may be grammatical errors inadvertently enclosed that do not reflect the nature of this encounter. Every attempt is made to correct such errors.  Delilah Shan [facility]  Pager: [phone] Office: [phone] 08/30/2021, 8:42 PM

## 2021-08-30 NOTE — Progress Notes (Signed)
ANTICOAGULATION CONSULT NOTE - Initial Consult  Pharmacy Consult for heparin Indication: chest pain/ACS  Allergies  Allergen Reactions   Clarithromycin Other (See Comments)    Caused stomach bleeding   Morphine And Related Nausea And Vomiting    hypertension   Zofran [Ondansetron Hcl] Nausea And Vomiting    Patient Measurements:   Heparin Dosing Weight: 66.9 kg   Vital Signs: Temp: 98.3 F (36.8 C) (10/31 1705) Temp Source: Oral (10/31 1705) BP: 142/93 (10/31 1705) Pulse Rate: 88 (10/31 1705)  Labs: No results for input(s): HGB, HCT, PLT, APTT, LABPROT, INR, HEPARINUNFRC, HEPRLOWMOCWT, CREATININE, CKTOTAL, CKMB, TROPONINIHS in the last 72 hours.  CrCl cannot be calculated (Patient's most recent lab result is older than the maximum 21 days allowed.).   Medical History: Past Medical History:  Diagnosis Date   Anxiety    Arthritis    Blood transfusion without reported diagnosis    as a newborn   GERD (gastroesophageal reflux disease)    Hypercholesterolemia    Hypertension    Reflux    Thyroid disease     Medications:  Scheduled:   aspirin  324 mg Oral NOW   Or   aspirin  300 mg Rectal NOW   [START ON 08/31/2021] aspirin EC  81 mg Oral Daily    Assessment: 50 yom presenting with intermittent chest pain for about 1 week with worsening pain today. No AC PTA.  Hgb 14.4, plt 85. Trop 10. No s/sx of bleeding.   Goal of Therapy:  Heparin level 0.3-0.7 units/ml Monitor platelets by anticoagulation protocol: Yes   Plan:  Give 4000 units bolus x 1 Start heparin infusion at 800 units/hr Check anti-Xa level in 6 hours and daily while on heparin Continue to monitor H&H and platelets  Sherron Monday, PharmD, BCCCP Clinical Pharmacist  Phone: [phone] 08/30/2021 5:18 PM  Please check AMION for all [facility] phone numbers After 10:00 PM, call Main Pharmacy [phone]

## 2021-08-30 NOTE — Progress Notes (Signed)
Date:  08/30/2021   ID:  Christian Lynch, DOB [birth year], MRN [id number]  PCP:  Bridget Hartshorn, NP  Cardiologist:  Rex Kras, DO, [facility] (established care November 10, 2020)  Date: 08/30/21 Last Office Visit: 03/24/2021  Chief Complaint  Patient presents with   Precordial pain   Follow-up    HPI  Christian Lynch is a 50 y.o. male who presents to the office with a chief complaint of "chest pain." Patient's past medical history and cardiovascular risk factors include: Hypertension, hyperlipidemia, mild coronary artery calcification, active smoking with at least 40-year pack history of smoking, marijuana smoking.  He is referred to the office at the request of Hemberg, Karie Schwalbe, NP for evaluation of chest pain.  Patient is accompanied by his wife [name] at today's visit.    Patient's wife called earlier this morning and stating that Christian Lynch is having chest pain and would like to be seen in the office.  Patient states that he is been having intermittent chest discomfort for about 1 week over the left anterior precordium, lasting for a few seconds, not able to comment on if it is effort related as he is not very physically active, and self-limited.  However, earlier today while at work at approximately 11 AM patient states that he was sitting down for lunch while drinking Dr. Malachi Bonds had anterior precordial chest pain across his chest, intensity 6 out of 10, sudden onset of diaphoresis, the pain is nonradiating, nonexertional.  The symptoms lasted about 20 minutes.  He continues to have residual throat pain.  He denies any active chest pain.  Unfortunately, he continues to smoke 1 pack/day despite being educated during multiple office encounters the importance of complete smoking cessation.  He also smokes marijuana last earlier this morning.    FUNCTIONAL STATUS: No structured exercise program or daily routine.   ALLERGIES: Allergies  Allergen Reactions   Clarithromycin  Other (See Comments)    Caused stomach bleeding   Morphine And Related Nausea And Vomiting    hypertension   Zofran [Ondansetron Hcl] Nausea And Vomiting    MEDICATION LIST PRIOR TO VISIT: Current Meds  Medication Sig   ALPRAZolam (XANAX) 0.5 MG tablet alprazolam 0.5 mg tablet  TAKE ONE TABLET AT BEDTIME AS NEEDED   amLODipine (NORVASC) 5 MG tablet TAKE ONE TABLET EVERY MORNING   levothyroxine (SYNTHROID) 175 MCG tablet Take 1 tablet by mouth daily.   losartan-hydrochlorothiazide (HYZAAR) 100-25 MG tablet Take 1 tablet by mouth daily.   meloxicam (MOBIC) 15 MG tablet meloxicam 15 mg tablet   omeprazole (PRILOSEC) 20 MG capsule Take 20 mg by mouth at bedtime.    pravastatin (PRAVACHOL) 80 MG tablet pravastatin 80 mg tablet  Take 1 tablet every day by oral route for 90 days.     PAST MEDICAL HISTORY: Past Medical History:  Diagnosis Date   Anxiety    Arthritis    Blood transfusion without reported diagnosis    as a newborn   GERD (gastroesophageal reflux disease)    Hypercholesterolemia    Hypertension    Reflux    Thyroid disease     PAST SURGICAL HISTORY: Past Surgical History:  Procedure Laterality Date   CARDIAC CATHETERIZATION     states no findings, in the 81s   WISDOM TOOTH EXTRACTION      FAMILY HISTORY: The patient family history includes Heart disease in his sister; Lung cancer in his mother; Pancreatic cancer in his father.  SOCIAL HISTORY:  The  patient  reports that he has been smoking cigarettes. He has been smoking an average of 1 pack per day. He has never used smokeless tobacco. He reports current alcohol use. He reports current drug use. Drug: Marijuana.  REVIEW OF SYSTEMS: Review of Systems  Constitutional: Negative for chills and fever.  HENT:  Negative for hoarse voice and nosebleeds.   Eyes:  Negative for discharge, double vision and pain.  Cardiovascular:  Positive for chest pain. Negative for claudication, dyspnea on exertion, leg swelling,  near-syncope, orthopnea, palpitations, paroxysmal nocturnal dyspnea and syncope.  Respiratory:  Negative for hemoptysis, shortness of breath and snoring.   Musculoskeletal:  Negative for muscle cramps and myalgias.  Gastrointestinal:  Negative for abdominal pain, constipation, diarrhea, hematemesis, hematochezia, melena, nausea and vomiting.  Neurological:  Negative for dizziness and light-headedness.   PHYSICAL EXAM: Vitals with BMI 08/30/2021 08/30/2021 03/24/2021  Height - 5\' 7"  5\' 7"   Weight - 147 lbs 6 oz 152 lbs 10 oz  BMI - [id number]  Systolic 0000000 [id number]  Diastolic 87 93 78  Pulse 95 68 88    CONSTITUTIONAL: Well-developed and well-nourished. No acute distress.  SKIN: Skin is warm and dry. No rash noted. No cyanosis. No pallor. No jaundice HEAD: Normocephalic and atraumatic.  EYES: No scleral icterus MOUTH/THROAT: Moist oral membranes.  NECK: No JVD present. No thyromegaly noted. No carotid bruits  LYMPHATIC: No visible cervical adenopathy.  CHEST Normal respiratory effort. No intercostal retractions  LUNGS: Clear to auscultation bilaterally. No stridor. No wheezes. No rales.  CARDIOVASCULAR: Regular rate and rhythm, positive S1-S2, no murmurs rubs or gallops appreciated. ABDOMINAL: No apparent ascites.  EXTREMITIES: No peripheral edema  HEMATOLOGIC: No significant bruising NEUROLOGIC: Oriented to person, place, and time. Nonfocal. Normal muscle tone.  PSYCHIATRIC: Normal mood and affect. Normal behavior. Cooperative  CARDIAC DATABASE: EKG: 08/30/2021: Normal sinus rhythm, 86 bpm, LVH per voltage criteria, downsloping ST depression in the lateral leads concerning for ischemia, without underlying injury pattern.   Echocardiogram: 11/26/2020: Normal LV systolic function with visual EF 55-60%. Left ventricle cavity is normal in size. Normal global wall motion. Normal diastolic filling pattern, normal LAP. Mild (Grade I) aortic regurgitation. Mild (Grade I) mitral  regurgitation. No prior study for comparison.  Stress Testing: 01/11/2021:  1 Day Rest/Stress Protocol. Patient exercised for 10 minutes and 29 seconds on Bruce protocol, achieved 12.58 METS, and 89% of age-predicted maximum heart rate. Stress ECG negative for ischemia. Overall normal myocardial perfusion, without convincing evidence of reversible myocardial ischemia or prior infarct. Left ventricular size: Dilated (EDV 149 cc). Left ventricular wall thickness is preserved without regional wall motion abnormalities. Calculated LVEF 41%. Low risk study.  CAC Score 03/09/2021: Total Agatston score: 7.8  [place] database percentile: 64 No acute or significant extracardiac abnormality.  Heart Catheterization: None  LABORATORY DATA: CBC Latest Ref Rng & Units 04/30/2017 12/27/2014 08/27/2010  WBC 4.0 - 10.5 K/uL 9.7 13.0(H) 12.8(H)  Hemoglobin 13.0 - 17.0 g/dL 14.5 15.6 13.0  Hematocrit 39.0 - 52.0 % 42.5 43.9 37.9(L)  Platelets 150 - 400 K/uL 168 160 166    CMP Latest Ref Rng & Units 04/30/2017 12/27/2014 08/27/2010  Glucose 65 - 99 mg/dL 100(H) 103(H) 107(H)  BUN 6 - 20 mg/dL 13 15 8   Creatinine 0.61 - 1.24 mg/dL 0.81 0.87 0.75  Sodium 135 - 145 mmol/L 142 139 140  Potassium 3.5 - 5.1 mmol/L 3.3(L) 3.3(L) 3.8  Chloride 101 - 111 mmol/L 106 105 107  CO2 22 - 32  mmol/L 26 24 26   Calcium 8.9 - 10.3 mg/dL 8.9 9.0 8.8  Total Protein 6.0 - 8.3 g/dL - 7.6 -  Total Bilirubin 0.3 - 1.2 mg/dL - 0.6 -  Alkaline Phos 39 - 117 U/L - 57 -  AST 0 - 37 U/L - 24 -  ALT 0 - 53 U/L - 32 -    Lipid Panel  No results found for: CHOL, TRIG, HDL, CHOLHDL, VLDL, LDLCALC, LDLDIRECT, LABVLDL  No components found for: NTPROBNP No results for input(s): PROBNP in the last 8760 hours. No results for input(s): TSH in the last 8760 hours.  BMP No results for input(s): NA, K, CL, CO2, GLUCOSE, BUN, CREATININE, CALCIUM, GFRNONAA, GFRAA in the last 8760 hours.  HEMOGLOBIN A1C No results found for: HGBA1C,  MPG   External Labs: Collected: 06/26/2020 TSH: 1.26  IMPRESSION:    ICD-10-CM   1. Unstable angina (HCC)  I20.0 EKG 12-Lead    2. Agatston coronary artery calcium score less than 100  R93.1     3. Mild mitral and aortic regurgitation  I08.0     4. Benign hypertension  I10     5. Mixed hyperlipidemia  E78.2     6. Cigarette smoker  F17.210     7. Marijuana smoker  F12.90        RECOMMENDATIONS: Christian Lynch is a 50 y.o. male whose past medical history and cardiac risk factors include: Hypertension, hyperlipidemia, mild coronary artery calcification, active smoking with at least 40-year pack history of smoking, marijuana smoking.  Unstable angina (HCC) Patient's symptoms are concerning for unstable angina, EKG shows downsloping ST depressions in the lateral leads new finding compared to the past but overall specificity is reduced given his underlying LVH.  POC echo notes preserved LVEF with minimal regional wall motion abnormalities involving the septum.  He continues to have throat discomfort during today's encounter and given his multiple cardiovascular risk factors as noted above recommended hospitalization for unstable angina.  Patient does not want to be transferred via EMS and would like to be directly admitted to the hospital.  Hospital bed requested and patient is going to [facility] for further evaluation.  Agatston coronary artery calcium score less than 100 Continue aspirin and statin therapy  Benign hypertension Office blood pressures within excellent control. Medications reconciled  Mixed hyperlipidemia Continue statin therapy  Cigarette smoker: Continues to smoke 1 pack/day Educated on the importance of complete smoking cessation  Marijuana smoker Educated on the importance of complete cessation of marijuana use.  Plan of care discussed with the patient and his wife [facility].  Reviewed EKG findings with both of them, given his symptoms over the last  several days, risk factors they are in agreement with being evaluated at [facility].    FINAL MEDICATION LIST END OF ENCOUNTER: No orders of the defined types were placed in this encounter.    Current Outpatient Medications:    ALPRAZolam (XANAX) 0.5 MG tablet, alprazolam 0.5 mg tablet  TAKE ONE TABLET AT BEDTIME AS NEEDED, Disp: , Rfl:    amLODipine (NORVASC) 5 MG tablet, TAKE ONE TABLET EVERY MORNING, Disp: 30 tablet, Rfl: 0   levothyroxine (SYNTHROID) 175 MCG tablet, Take 1 tablet by mouth daily., Disp: , Rfl:    losartan-hydrochlorothiazide (HYZAAR) 100-25 MG tablet, Take 1 tablet by mouth daily., Disp: , Rfl:    meloxicam (MOBIC) 15 MG tablet, meloxicam 15 mg tablet, Disp: , Rfl:    omeprazole (PRILOSEC) 20 MG capsule, Take  20 mg by mouth at bedtime. , Disp: , Rfl: 3   pravastatin (PRAVACHOL) 80 MG tablet, pravastatin 80 mg tablet  Take 1 tablet every day by oral route for 90 days., Disp: , Rfl:   Orders Placed This Encounter  Procedures   EKG 12-Lead    There are no Patient Instructions on file for this visit.   --Continue cardiac medications as reconciled in final medication list. --Return in about 2 weeks (around 09/13/2021) for Posthospitalization. Or sooner if needed. --Continue follow-up with your primary care physician regarding the management of your other chronic comorbid conditions.  Patient's questions and concerns were addressed to his satisfaction. He voices understanding of the instructions provided during this encounter.   This note was created using a voice recognition software as a result there may be grammatical errors inadvertently enclosed that do not reflect the nature of this encounter. Every attempt is made to correct such errors.  Rex Kras, [place], [facility]  Pager: [phone] Office: [phone]

## 2021-08-31 ENCOUNTER — Observation Stay (HOSPITAL_COMMUNITY): Payer: 59

## 2021-08-31 ENCOUNTER — Ambulatory Visit (HOSPITAL_COMMUNITY): Admission: RE | Admit: 2021-08-31 | Payer: 59 | Source: Home / Self Care | Admitting: Cardiology

## 2021-08-31 ENCOUNTER — Encounter (HOSPITAL_COMMUNITY): Payer: Self-pay | Admitting: Cardiology

## 2021-08-31 ENCOUNTER — Other Ambulatory Visit: Payer: Self-pay

## 2021-08-31 DIAGNOSIS — U071 COVID-19: Secondary | ICD-10-CM

## 2021-08-31 DIAGNOSIS — I200 Unstable angina: Secondary | ICD-10-CM | POA: Diagnosis present

## 2021-08-31 DIAGNOSIS — R072 Precordial pain: Secondary | ICD-10-CM | POA: Diagnosis not present

## 2021-08-31 LAB — CBC
HCT: 38.2 % — ABNORMAL LOW (ref 39.0–52.0)
Hemoglobin: 13.4 g/dL (ref 13.0–17.0)
MCH: 32.2 pg (ref 26.0–34.0)
MCHC: 35.1 g/dL (ref 30.0–36.0)
MCV: 91.8 fL (ref 80.0–100.0)
Platelets: 84 10*3/uL — ABNORMAL LOW (ref 150–400)
RBC: 4.16 MIL/uL — ABNORMAL LOW (ref 4.22–5.81)
RDW: 12.3 % (ref 11.5–15.5)
WBC: 4.5 10*3/uL (ref 4.0–10.5)
nRBC: 0 % (ref 0.0–0.2)

## 2021-08-31 LAB — ECHOCARDIOGRAM COMPLETE
Area-P 1/2: 3.74 cm2
Calc EF: 62.4 %
Height: 67 in
S' Lateral: 3.9 cm
Single Plane A2C EF: 63.9 %
Single Plane A4C EF: 59.8 %
Weight: 2299.84 oz

## 2021-08-31 LAB — BASIC METABOLIC PANEL
Anion gap: 10 (ref 5–15)
BUN: 11 mg/dL (ref 6–20)
CO2: 23 mmol/L (ref 22–32)
Calcium: 8.5 mg/dL — ABNORMAL LOW (ref 8.9–10.3)
Chloride: 104 mmol/L (ref 98–111)
Creatinine, Ser: 0.91 mg/dL (ref 0.61–1.24)
GFR, Estimated: >60 mL/min (ref >60)
Glucose, Bld: 83 mg/dL (ref 70–99)
Potassium: 5.1 mmol/L (ref 3.5–5.1)
Sodium: 137 mmol/L (ref 135–145)

## 2021-08-31 LAB — RAPID URINE DRUG SCREEN, HOSP PERFORMED
Amphetamines: NOT DETECTED
Barbiturates: NOT DETECTED
Benzodiazepines: POSITIVE — AB
Cocaine: NOT DETECTED
Opiates: NOT DETECTED
Tetrahydrocannabinol: POSITIVE — AB

## 2021-08-31 LAB — LIPID PANEL
Cholesterol: 103 mg/dL (ref 0–200)
HDL: 34 mg/dL — ABNORMAL LOW (ref >40)
LDL Cholesterol: 44 mg/dL (ref 0–99)
Total CHOL/HDL Ratio: 3 RATIO
Triglycerides: 124 mg/dL (ref <150)
VLDL: 25 mg/dL (ref 0–40)

## 2021-08-31 LAB — TROPONIN I (HIGH SENSITIVITY)
Troponin I (High Sensitivity): 8 ng/L (ref <18)
Troponin I (High Sensitivity): 9 ng/L (ref <18)

## 2021-08-31 LAB — RESP PANEL BY RT-PCR (FLU A&B, COVID) ARPGX2
Influenza A by PCR: NEGATIVE
Influenza B by PCR: NEGATIVE
SARS Coronavirus 2 by RT PCR: POSITIVE — AB

## 2021-08-31 LAB — LDL CHOLESTEROL, DIRECT: Direct LDL: 52.3 mg/dL (ref 0–99)

## 2021-08-31 LAB — HEPARIN LEVEL (UNFRACTIONATED)
Heparin Unfractionated: 0.47 IU/mL (ref 0.30–0.70)
Heparin Unfractionated: 0.53 IU/mL (ref 0.30–0.70)

## 2021-08-31 SURGERY — LEFT HEART CATH AND CORONARY ANGIOGRAPHY
Anesthesia: LOCAL

## 2021-08-31 MED ORDER — SODIUM CHLORIDE 0.9% FLUSH: 3.0000 mL | INTRAVENOUS | Status: DC | PRN

## 2021-08-31 MED ORDER — ASPIRIN 81 MG PO CHEW
81.0000 mg | CHEWABLE_TABLET | ORAL | Status: AC
  Administered 2021-08-31: 81 mg via ORAL
  Filled 2021-08-31: qty 1

## 2021-08-31 MED ORDER — NICOTINE 14 MG/24HR TD PT24
14.0000 mg | MEDICATED_PATCH | Freq: Every day | TRANSDERMAL | Status: DC
  Filled 2021-08-31: qty 1

## 2021-08-31 MED ORDER — SODIUM CHLORIDE 0.9 % WEIGHT BASED INFUSION
1.0000 mL/kg/h | INTRAVENOUS | Status: DC
  Administered 2021-08-31: 1 mL/kg/h via INTRAVENOUS

## 2021-08-31 MED ORDER — SODIUM CHLORIDE 0.9% FLUSH
3.0000 mL | Freq: Two times a day (BID) | INTRAVENOUS | Status: DC
  Administered 2021-08-31: 3 mL via INTRAVENOUS

## 2021-08-31 MED ORDER — SODIUM CHLORIDE 0.9 % WEIGHT BASED INFUSION: 3.0000 mL/kg/h | INTRAVENOUS | Status: DC

## 2021-08-31 MED ORDER — NITROGLYCERIN 0.4 MG SL SUBL: 0.4000 mg | SUBLINGUAL_TABLET | SUBLINGUAL | 0 refills | Status: AC | PRN

## 2021-08-31 MED ORDER — SODIUM CHLORIDE 0.9 % WEIGHT BASED INFUSION: 1.0000 mL/kg/h | INTRAVENOUS | Status: DC

## 2021-08-31 MED ORDER — ALPRAZOLAM 0.5 MG PO TABS
0.5000 mg | ORAL_TABLET | Freq: Three times a day (TID) | ORAL | Status: DC | PRN
  Administered 2021-08-31: 0.5 mg via ORAL
  Filled 2021-08-31: qty 1

## 2021-08-31 MED ORDER — ASPIRIN EC 81 MG PO TBEC: 81.0000 mg | DELAYED_RELEASE_TABLET | Freq: Every day | ORAL | Status: DC

## 2021-08-31 MED ORDER — SODIUM CHLORIDE 0.9 % IV SOLN: 250.0000 mL | INTRAVENOUS | Status: DC | PRN

## 2021-08-31 MED ORDER — METOPROLOL TARTRATE 25 MG PO TABS: 25.0000 mg | ORAL_TABLET | Freq: Two times a day (BID) | ORAL | 2 refills | Status: DC

## 2021-08-31 MED ORDER — SODIUM CHLORIDE 0.9 % WEIGHT BASED INFUSION
3.0000 mL/kg/h | INTRAVENOUS | Status: AC
  Administered 2021-08-31: 3 mL/kg/h via INTRAVENOUS

## 2021-08-31 MED ORDER — ASPIRIN 81 MG PO TBEC
81.0000 mg | DELAYED_RELEASE_TABLET | Freq: Every day | ORAL | 11 refills | Status: DC
Start: 2021-09-01 — End: 2022-08-17

## 2021-08-31 NOTE — Plan of Care (Signed)

## 2021-08-31 NOTE — Consult Note (Addendum)
Medical Consultation   Christian Lynch  UEA:[id number]  DOB: 1971-10-25  DOA: 08/30/2021  PCP: Rebecka Apley, NP   Outpatient Specialists: Odis Hollingshead - cardiology; Leone Payor - GI    Requesting physician: Odis Hollingshead - cardiology  Reason for consultation: Admitted yesterday for concern for [place].  New ST depressions, +LVH.  Scheduled for cath today.  He is COVID +.  No hypoxia, tachycardia.  Needs consult - ?meds, treatment, etc.  Echo ordered, may cancel cath.   Will be on heparin x 48 hours and treat medically.   History of Present Illness: Christian Lynch is an 50 y.o. male with h/o HTN and HLD presenting with chest pain.  He reports that he is unvaccinated against COVID and was exposed to his daughter when she was actively infected last week.  Yesterday, he noticed a sore throat and mild cough.  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  Overall he feels generally well today other than mild persistent cough.  He is not interested in receiving treatment for COVID at this time.    Review of Systems:  ROS As per HPI otherwise review of systems negative.    Past Medical History: Past Medical History:  Diagnosis Date   Anxiety    Arthritis    Blood transfusion without reported diagnosis    as a newborn   GERD (gastroesophageal reflux disease)    Hypercholesterolemia    Hypertension    Reflux    Thyroid disease     Past Surgical History: Past Surgical History:  Procedure Laterality Date   CARDIAC CATHETERIZATION     states no findings, in the 72s   WISDOM TOOTH EXTRACTION       Allergies:   Allergies  Allergen Reactions   Clarithromycin Other (See Comments)    Caused stomach bleeding   Morphine And Related Nausea And Vomiting    hypertension   Zofran [Ondansetron Hcl] Nausea And Vomiting   Hydrocodone Rash   Oxycodone  Rash     Social History:  reports that he has been smoking cigarettes. He has a 20.00 pack-year smoking history. He has never used smokeless tobacco. He reports current alcohol use. He reports current drug use. Drug: Marijuana.   Family History: Family History  Problem Relation Age of Onset   Lung cancer Mother    Pancreatic cancer Father    Heart disease Sister    Colon cancer Neg Hx    Colon polyps Neg Hx    Esophageal cancer Neg Hx    Rectal cancer Neg Hx    Stomach cancer Neg Hx       Physical Exam: Vitals:   08/31/21 0000 08/31/21 0100 08/31/21 0200 08/31/21 0507  BP: 117/76 111/78 110/77 112/82  Pulse:    75  Resp: (!) 23 (!) 22 (!) 23 16  Temp:    98.2 F (36.8 C)  TempSrc:    Oral  SpO2: 98% 96% 96% 97%  Weight:    65.2 kg    Constitutional: Alert and awake, oriented x3, not in any acute distress.  Periodic dry cough. Eyes: Thick glasses, EOMI, irises appear normal, anicteric sclera,  ENMT: external ears and nose appear normal, normal hearing, Lips appear normal Neck: neck appears normal, no masses, normal ROM CVS: S1-S2 clear, no  murmur rubs or gallops, no LE edema, normal pedal pulses  Respiratory:  clear to auscultation bilaterally, no wheezing, rales or rhonchi. Respiratory effort normal to mildly increased. No accessory muscle use.  Abdomen: soft nontender, nondistended Musculoskeletal: : no cyanosis, clubbing or edema noted bilaterally Neuro: Cranial nerves II-XII grossly intact Psych: judgement and insight appear normal, stable mood and affect, mental status Skin: no rashes or lesions or ulcers, no induration or nodules    Data reviewed:  I have personally reviewed the recent labs and imaging studies  Pertinent Labs:   Unremarkable BMP Lipids: 103/34/44/124 Platelets 84 COVID POSITIVE   Inpatient Medications:   Scheduled Meds:  ALPRAZolam  0.5 mg Oral QHS   amLODipine  5 mg Oral q morning   [START ON 09/01/2021] aspirin EC  81 mg Oral Daily    atorvastatin  80 mg Oral QHS   losartan  100 mg Oral Daily   And   hydrochlorothiazide  25 mg Oral Daily   levothyroxine  175 mcg Oral Q0600   metoprolol tartrate  25 mg Oral BID   nicotine  14 mg Transdermal Daily   pantoprazole  40 mg Oral Daily   sodium chloride flush  3 mL Intravenous Q12H   Continuous Infusions:  sodium chloride     0.9 % NaCl with KCl 20 mEq / L 50 mL/hr at 08/30/21 1826   sodium chloride 1 mL/kg/hr (08/31/21 0748)   heparin 800 Units/hr (08/30/21 1832)   nitroGLYCERIN 5 mcg/min (08/30/21 1836)     Radiological Exams on Admission: No results found.  Impression/Recommendations Principal Problem:   Unstable angina (HCC) Active Problems:   Abnormal EKG   Coronary atherosclerosis due to calcified coronary lesion   Cigarette smoker   Marijuana abuse   Benign hypertension   Mixed hyperlipidemia   COVID-19 virus infection  COVID -Patient without immunizations and with known exposure presenting with chest pain -Mild symptoms c/w COVID to include sore throat, congestion, cough -No O2 requirement -COVID POSITIVE -The patient has no interest in receiving treatment for COVID at this time -If he changes his mind, a course of Paxlovid or Molnupiravir would be appropriate -Will add pulse ox with vitals -If worsening symptoms and/or hypoxia, order CXR and reconsult TRH  Chest pain -Patient presenting with concern for [place] -HS troponin x 1 negative -Echo pending -It would not be unreasonable to perform cardiac cath either now or as an outpatient later -He is on heparin and this is planned to continue for 48 hours -Management per cardiology  HTN -Continue Norvasc, Hyzaar  Hypothyroidism -Continue Synthroid  Thrombocytopenia -New since 2018 -Appears to be stable currently -Recommend outpatient f/u -No treatment needed at this time  Tobacco dependence -Encourage cessation.   -This was discussed with the patient and should be reviewed on an  ongoing basis.   -Patch ordered  Marijuana dependence -Cessation encouraged; this should be encouraged on an ongoing basis -UDS ordered      Thank you for this consultation.  Our [facility] hospitalist team will sign off at this time.  If additional issues arise, please re-consult.   Time Spent: 50 minutes  Jonah Blue M.D. [facility] 08/31/2021, 10:25 AM

## 2021-08-31 NOTE — Progress Notes (Signed)
ANTICOAGULATION CONSULT NOTE - Follow Up Consult  Pharmacy Consult for heparin Indication:  USAP  Labs: Recent Labs    08/30/21 1812 08/30/21 2349  HGB 14.4  --   HCT 41.2  --   PLT 85*  --   HEPARINUNFRC  --  0.53  CREATININE 0.76  --   TROPONINIHS 10  --     Assessment/Plan:  50yo male therapeutic on heparin with initial dosing for CP. Will continue infusion at current rate of 800 units/hr and confirm stable with am labs.   Vernard Gambles, PharmD, BCPS  08/31/2021,12:40 AM

## 2021-08-31 NOTE — Discharge Summary (Signed)
Physician Discharge Summary  Patient ID: KHAIDYN STAEBELL MRN: [id number] DOB/AGE: 04-02-1971 50 y.o.  Admit date: 08/30/2021 Discharge date: 08/31/2021  Primary Discharge Diagnosis: Precordial pain-concerning for unstable angina COVID-19 Abnormal EKG Thrombocytopenia  Secondary Discharge Diagnosis: Mild coronary artery calcification Cigarette smoker Marijuana smoker Hypertension Hyperlipidemia  Hospital Course:   50 y.o. Caucasian male  with Hypertension, hyperlipidemia, mild coronary artery calcification, active smoking with at least 40-year pack history of smoking, marijuana smoking.  Presented for a sick visit at the office due to new onset of chest pain while at work.  Given the characteristics of this discomfort, and EKG at the office noting subtle ST depression in the lateral leads there was concern for unstable angina.  Of note, he does have underlying LVH but in the clinical setting further evaluation is warranted.  Patient was admitted to the hospital for further evaluation.  His initial troponin was negative x1, was started on IV heparin and nitro drip, and was tentatively scheduled for left heart catheterization with possible intervention the following day.  However, in the work-up he was noted to have been COVID-positive and was evaluated by internal medicine.  Patient remained chest pain-free after discontinuation of nitro drip.  The initial plan was to continue IV heparin drip for 48 hours prior to discontinuation.  However, patient was anxious to go home as he was chest pain-free and the shared decision was to discharge him home on his current home medications in addition to metoprolol.  Patient is educated on the importance of complete cessation of smoking tobacco and marijuana.  He was also noted to have an incidental finding of thrombocytopenia probably secondary to COVID-19.  He is asked to follow-up with PCP.  Discharge Exam: Today's Vitals   08/31/21 0100  08/31/21 0200 08/31/21 0507 08/31/21 2035  BP: 111/78 110/77 112/82 (!) 147/91  Pulse:   75   Resp: (!) 22 (!) 23 16 16   Temp:   98.2 F (36.8 C) 98 F (36.7 C)  TempSrc:   Oral Oral  SpO2: 96% 96% 97% 98%  Weight:   65.2 kg   PainSc:       Body mass index is 22.51 kg/m.   PHYSICAL EXAM: Vitals with BMI 08/31/2021 08/31/2021 08/31/2021  Height - - -  Weight - 143 lbs 12 oz -  BMI - 22.51 -  Systolic 147 112 13/10/2020  Diastolic 91 82 77  Pulse - 75 -    CONSTITUTIONAL: Well-developed and well-nourished. No acute distress.  SKIN: Skin is warm and dry. No rash noted. No cyanosis. No pallor. No jaundice HEAD: Normocephalic and atraumatic.  EYES: No scleral icterus MOUTH/THROAT: Moist oral membranes.  NECK: No JVD present. No thyromegaly noted. No carotid bruits  LYMPHATIC: No visible cervical adenopathy.  CHEST Normal respiratory effort. No intercostal retractions  LUNGS: Clear to auscultation bilaterally. No stridor. No wheezes. No rales.  CARDIOVASCULAR: Regular rate and rhythm, positive S1-S2, no murmurs rubs or gallops appreciated. ABDOMINAL: No apparent ascites.  EXTREMITIES: No peripheral edema  HEMATOLOGIC: No significant bruising NEUROLOGIC: Oriented to person, place, and time. Nonfocal. Normal muscle tone.  PSYCHIATRIC: Normal mood and affect. Normal behavior. Cooperative   Recommendations on discharge:  Patient refused inpatient treatment for COVID-19 pneumonia.  Patient is not hypoxic or in respiratory distress and wishes to be self quarantine.  Of note, he is not vaccinated for COVID-19 pneumonia.  With regards to chest pain he is currently asymptomatic.  We will continue with his home medications with addition  of metoprolol.  Prescription sent to his pharmacy and a prescription for sublingual nitroglycerin tablets also given for as needed basis.  Patient is informed that he has thrombocytopenia and needs outpatient follow-up with his PCP once discharged within a  week.  CARDIAC DATABASE: EKG: 08/30/2021: Normal sinus rhythm, 86 bpm, LVH per voltage criteria, downsloping ST depression in the lateral leads concerning for ischemia, without underlying injury pattern.    Echocardiogram: 08/31/2021:  1. Left ventricular ejection fraction, by estimation, is 55 to 60%. The  left ventricle has normal function. The left ventricle has no regional wall motion abnormalities. Left ventricular diastolic parameters were normal.   2. Right ventricular systolic function is normal. The right ventricular size is normal.   3. The mitral valve is normal in structure. Mild mitral valve regurgitation. No evidence of mitral stenosis.   4. The aortic valve was not well visualized. Aortic valve regurgitation is not visualized. No aortic stenosis is present.    Stress Testing: 01/11/2021:  1 Day Rest/Stress Protocol. Patient exercised for 10 minutes and 29 seconds on Bruce protocol, achieved 12.58 METS, and 89% of age-predicted maximum heart rate. Stress ECG negative for ischemia. Overall normal myocardial perfusion, without convincing evidence of reversible myocardial ischemia or prior infarct. Left ventricular size: Dilated (EDV 149 cc). Left ventricular wall thickness is preserved without regional wall motion abnormalities. Calculated LVEF 41%. Low risk study.  CAC Score 03/09/2021: Total Agatston score: 7.8  [place] database percentile: 64 No acute or significant extracardiac abnormality.   Heart Catheterization: None  Labs:   Lab Results  Component Value Date   WBC 4.5 08/31/2021   HGB 13.4 08/31/2021   HCT 38.2 (L) 08/31/2021   MCV 91.8 08/31/2021   PLT 84 (L) 08/31/2021    Recent Labs  Lab 08/30/21 1812 08/31/21 0338  NA 134* 137  K 2.7* 5.1  CL 100 104  CO2 24 23  BUN 10 11  CREATININE 0.76 0.91  CALCIUM 8.5* 8.5*  PROT 6.0*  --   BILITOT 0.7  --   ALKPHOS 39  --   ALT 30  --   AST 50*  --   GLUCOSE 98 83    Lipid Panel     Component  Value Date/Time   CHOL 103 08/31/2021 0338   TRIG 124 08/31/2021 0338   HDL 34 (L) 08/31/2021 0338   CHOLHDL 3.0 08/31/2021 0338   VLDL 25 08/31/2021 0338   LDLCALC 44 08/31/2021 0338    BNP (last 3 results) No results for input(s): BNP in the last 8760 hours.  HEMOGLOBIN A1C Lab Results  Component Value Date   HGBA1C 5.4 08/30/2021   MPG 108.28 08/30/2021    Cardiac Panel (last 3 results) No results for input(s): CKTOTAL, CKMB, TROPONINI, RELINDX in the last 8760 hours.  Lab Results  Component Value Date   CKTOTAL 104 04/30/2017     TSH Recent Labs    08/30/21 1812  TSH 3.081    Radiology: DG Chest 1 View  Result Date: 08/31/2021 CLINICAL DATA:  COVID-19 EXAM: CHEST  1 VIEW COMPARISON:  Portable exam 1737 hours compared to 04/30/2017 FINDINGS: Normal heart size, mediastinal contours, and pulmonary vascularity. Atherosclerotic calcifications aorta. Lungs clear. No pulmonary infiltrate, pleural effusion, or pneumothorax. Bones unremarkable. IMPRESSION: No acute abnormalities. Aortic Atherosclerosis (ICD10-I70.0). Electronically Signed   By: Ulyses Southward M.D.   On: 08/31/2021 17:58   ECHOCARDIOGRAM COMPLETE  Result Date: 08/31/2021    ECHOCARDIOGRAM REPORT   Patient Name:  Oralia Manis Date of Exam: 08/31/2021 Medical Rec #:  [id number]       Height:       67.0 in Accession #:    [id number]      Weight:       143.7 lb Date of Birth:  1971/10/22        BSA:          1.757 m Patient Age:    50 years        BP:           116/79 mmHg Patient Gender: M               HR:           70 bpm. Exam Location:  Inpatient Procedure: 2D Echo, Cardiac Doppler and Color Doppler Indications:     Chest Pain  History:         Patient has prior history of Echocardiogram examinations, most                  recent 11/26/2020. Risk Factors:Smokes Marijuanes Daily. Covid                  19 positive.  Sonographer:     Roosvelt Maser RDCS Referring Phys:  [id number] Presly Steinruck Diagnosing Phys: Tessa Lerner DO  IMPRESSIONS  1. Left ventricular ejection fraction, by estimation, is 55 to 60%. The left ventricle has normal function. The left ventricle has no regional wall motion abnormalities. Left ventricular diastolic parameters were normal.  2. Right ventricular systolic function is normal. The right ventricular size is normal.  3. The mitral valve is normal in structure. Mild mitral valve regurgitation. No evidence of mitral stenosis.  4. The aortic valve was not well visualized. Aortic valve regurgitation is not visualized. No aortic stenosis is present. FINDINGS  Left Ventricle: Left ventricular ejection fraction, by estimation, is 55 to 60%. The left ventricle has normal function. The left ventricle has no regional wall motion abnormalities. The left ventricular internal cavity size was normal in size. There is  no left ventricular hypertrophy. Left ventricular diastolic parameters were normal. Right Ventricle: The right ventricular size is normal. No increase in right ventricular wall thickness. Right ventricular systolic function is normal. Left Atrium: Left atrial size was normal in size. Right Atrium: Right atrial size was normal in size. Pericardium: There is no evidence of pericardial effusion. Mitral Valve: The mitral valve is normal in structure. Mild mitral valve regurgitation. No evidence of mitral valve stenosis. Tricuspid Valve: The tricuspid valve is normal in structure. Tricuspid valve regurgitation is trivial. No evidence of tricuspid stenosis. Aortic Valve: The aortic valve was not well visualized. Aortic valve regurgitation is not visualized. No aortic stenosis is present. Pulmonic Valve: The pulmonic valve was normal in structure. Pulmonic valve regurgitation is trivial. No evidence of pulmonic stenosis. Aorta: The aortic root and ascending aorta are structurally normal, with no evidence of dilitation. IAS/Shunts: The atrial septum is grossly normal.  LEFT VENTRICLE PLAX 2D LVIDd:         5.40 cm       Diastology LVIDs:         3.90 cm      LV e' medial:    9.03 cm/s LV PW:         0.90 cm      LV E/e' medial:  9.1 LV IVS:        1.00 cm      LV e'  lateral:   12.00 cm/s LVOT diam:     2.30 cm      LV E/e' lateral: 6.9 LV SV:         62 LV SV Index:   35 LVOT Area:     4.15 cm  LV Volumes (MOD) LV vol d, MOD A2C: 111.0 ml LV vol d, MOD A4C: 104.0 ml LV vol s, MOD A2C: 40.1 ml LV vol s, MOD A4C: 41.8 ml LV SV MOD A2C:     70.9 ml LV SV MOD A4C:     104.0 ml LV SV MOD BP:      70.3 ml LEFT ATRIUM             Index        RIGHT ATRIUM           Index LA diam:        3.50 cm 1.99 cm/m   RA Area:     15.40 cm LA Vol (A2C):   45.6 ml 25.95 ml/m  RA Volume:   42.70 ml  24.30 ml/m LA Vol (A4C):   28.8 ml 16.39 ml/m LA Biplane Vol: 39.1 ml 22.25 ml/m  AORTIC VALVE LVOT Vmax:   72.80 cm/s LVOT Vmean:  46.300 cm/s LVOT VTI:    0.150 m  AORTA Ao Root diam: 3.30 cm Ao Asc diam:  2.90 cm MITRAL VALVE MV Area (PHT): 3.74 cm    SHUNTS MV Decel Time: 203 msec    Systemic VTI:  0.15 m MV E velocity: 82.30 cm/s  Systemic Diam: 2.30 cm MV A velocity: 87.80 cm/s MV E/A ratio:  0.94 Brette Cast DO Electronically signed by Tessa Lerner DO Signature Date/Time: 08/31/2021/5:11:14 PM    Final      FOLLOW UP PLANS AND APPOINTMENTS  Allergies as of 08/31/2021       Reactions   Clarithromycin Other (See Comments)   Caused stomach bleeding   Morphine And Related Nausea And Vomiting   hypertension   Zofran [ondansetron Hcl] Nausea And Vomiting   Hydrocodone Rash   Oxycodone Rash        Medication List     TAKE these medications    ALPRAZolam 0.5 MG tablet Commonly known as: XANAX Take 0.5 mg by mouth at bedtime.   amLODipine 5 MG tablet Commonly known as: NORVASC TAKE ONE TABLET EVERY MORNING What changed: when to take this   aspirin 81 MG EC tablet Take 1 tablet (81 mg total) by mouth daily. Swallow whole. Start taking on: September 01, 2021   levothyroxine 175 MCG tablet Commonly known as:  SYNTHROID Take 175 mcg by mouth daily.   losartan-hydrochlorothiazide 100-25 MG tablet Commonly known as: HYZAAR Take 1 tablet by mouth daily.   meloxicam 15 MG tablet Commonly known as: MOBIC Take 15 mg by mouth daily.   metoprolol tartrate 25 MG tablet Commonly known as: LOPRESSOR Take 1 tablet (25 mg total) by mouth 2 (two) times daily.   nitroGLYCERIN 0.4 MG SL tablet Commonly known as: NITROSTAT Place 1 tablet (0.4 mg total) under the tongue every 5 (five) minutes x 3 doses as needed for chest pain.   omeprazole 20 MG capsule Commonly known as: PRILOSEC Take 20 mg by mouth daily.   pravastatin 80 MG tablet Commonly known as: PRAVACHOL Take 80 mg by mouth daily.        Total time spent on patient's discharge was 38 minutes.  Tessa Lerner, [place], [facility]  Pager: [phone] Office: [phone]

## 2021-08-31 NOTE — Progress Notes (Signed)
ANTICOAGULATION CONSULT NOTE - Follow Up Consult  Pharmacy Consult for Heparin Indication: chest pain/ACS  Allergies  Allergen Reactions   Clarithromycin Other (See Comments)    Caused stomach bleeding   Morphine And Related Nausea And Vomiting    hypertension   Zofran [Ondansetron Hcl] Nausea And Vomiting   Hydrocodone Rash   Oxycodone Rash    Patient Measurements: Weight: 65.2 kg (143 lb 11.8 oz) Heparin Dosing Weight:    Vital Signs: Temp: 98.2 F (36.8 C) (11/01 0507) Temp Source: Oral (11/01 0507) BP: 112/82 (11/01 0507) Pulse Rate: 75 (11/01 0507)  Labs: Recent Labs    08/30/21 1812 08/30/21 2349 08/31/21 0338 08/31/21 0542 08/31/21 0551  HGB 14.4  --   --   --  13.4  HCT 41.2  --   --   --  38.2*  PLT 85*  --   --   --  84*  HEPARINUNFRC  --  0.53  --  0.47  --   CREATININE 0.76  --  0.91  --   --   TROPONINIHS 10  --   --   --   --     Estimated Creatinine Clearance: 87.6 mL/min (by C-G formula based on SCr of 0.91 mg/dL).   Assessment:  Anticoag: Hep gtt for ACS - no AC PTA. Hep level 0.47 in goal. CBC WNL  Goal of Therapy:  Heparin level 0.3-0.7 units/ml Monitor platelets by anticoagulation protocol: Yes   Plan:  Cath IV heparin 800 units/hr Daily HL and CBC   Ediberto Sens S. Merilynn Finland, PharmD, BCPS Clinical Staff Pharmacist [email or web address]  Merilynn Finland, [place] Stillinger 08/31/2021,8:14 AM

## 2021-08-31 NOTE — Progress Notes (Signed)
  Echocardiogram 2D Echocardiogram has been performed.  Roosvelt Maser F 08/31/2021, 9:32 AM

## 2021-09-01 ENCOUNTER — Telehealth: Payer: Self-pay

## 2021-09-01 NOTE — Telephone Encounter (Signed)
EKG on arrival

## 2021-09-16 ENCOUNTER — Ambulatory Visit: Payer: 59 | Admitting: Cardiology

## 2021-09-16 ENCOUNTER — Encounter: Payer: Self-pay | Admitting: Cardiology

## 2021-09-16 ENCOUNTER — Other Ambulatory Visit: Payer: Self-pay

## 2021-09-16 VITALS — BP 116/77 | HR 70 | Resp 16 | Ht 67.0 in | Wt 150.8 lb

## 2021-09-16 DIAGNOSIS — I10 Essential (primary) hypertension: Secondary | ICD-10-CM

## 2021-09-16 DIAGNOSIS — E782 Mixed hyperlipidemia: Secondary | ICD-10-CM

## 2021-09-16 DIAGNOSIS — R9431 Abnormal electrocardiogram [ECG] [EKG]: Secondary | ICD-10-CM

## 2021-09-16 DIAGNOSIS — R931 Abnormal findings on diagnostic imaging of heart and coronary circulation: Secondary | ICD-10-CM

## 2021-09-16 DIAGNOSIS — I080 Rheumatic disorders of both mitral and aortic valves: Secondary | ICD-10-CM

## 2021-09-16 DIAGNOSIS — Z8616 Personal history of COVID-19: Secondary | ICD-10-CM

## 2021-09-16 NOTE — Progress Notes (Signed)
Date:  09/16/2021   ID:  Christian Lynch, DOB 12/21/1970, MRN [id number]  PCP:  Bridget Hartshorn, NP  Cardiologist:  Rex Kras, DO, [facility] (established care November 10, 2020)  Date: 09/16/21 Last Office Visit: 08/30/2021   Chief Complaint  Patient presents with   Posthospitalization   Follow-up   Chest Pain    HPI  Christian Lynch is a 50 y.o. male who presents to the office with a chief complaint of ": Follow-up after recent hospitalization and reevaluation of chest pain." Patient's past medical history and cardiovascular risk factors include: COVID 19 Pneumonia, Hypertension, hyperlipidemia, mild coronary artery calcification, history of COVID-19 infection, former smoker (08/31/2021 hx of at least 40-year pack history), hx of marijuana smoking.  He is referred to the office at the request of Hemberg, Karie Schwalbe, NP for evaluation of chest pain.  Patient is accompanied by his wife [name] at today's visit.    Patient came in for sick visit on 08/30/2021 with new onset of chest pain and EKG changes concerning for underlying ischemia.  Given his symptoms and risk factors he was directed to ED for further evaluation and management.  Work-up in the hospital noted COVID-19 pneumonia and patient was treated and later discharged home.  He did have an echocardiogram to reevaluate LVEF.  His EF was noted to be preserved and therefore no additional testing was performed as majority of his symptoms was likely due to COVID.  Since discharge patient states that he is doing well from a cardiovascular standpoint.  He does not have chest pain.  However EKG today still notes T wave inversions in the inferior leads concerning for possible ischemia when compared to prior ECGs.  He has successfully stopped smoking as of 08/31/2021 for which he is congratulated for today's office visit.   FUNCTIONAL STATUS: No structured exercise program or daily routine.   ALLERGIES: Allergies  Allergen  Reactions   Clarithromycin Other (See Comments)    Caused stomach bleeding   Morphine And Related Nausea And Vomiting    hypertension   Zofran [Ondansetron Hcl] Nausea And Vomiting   Hydrocodone Rash   Oxycodone Rash    MEDICATION LIST PRIOR TO VISIT: Current Meds  Medication Sig   ALPRAZolam (XANAX) 0.5 MG tablet Take 0.5 mg by mouth at bedtime.   amLODipine (NORVASC) 5 MG tablet TAKE ONE TABLET EVERY MORNING (Patient taking differently: Take 5 mg by mouth daily.)   aspirin EC 81 MG EC tablet Take 1 tablet (81 mg total) by mouth daily. Swallow whole.   levothyroxine (SYNTHROID) 175 MCG tablet Take 175 mcg by mouth daily.   losartan-hydrochlorothiazide (HYZAAR) 100-25 MG tablet Take 1 tablet by mouth daily.   meloxicam (MOBIC) 15 MG tablet Take 15 mg by mouth daily.   metoprolol tartrate (LOPRESSOR) 25 MG tablet Take 1 tablet (25 mg total) by mouth 2 (two) times daily.   nitroGLYCERIN (NITROSTAT) 0.4 MG SL tablet Place 1 tablet (0.4 mg total) under the tongue every 5 (five) minutes x 3 doses as needed for chest pain.   omeprazole (PRILOSEC) 20 MG capsule Take 20 mg by mouth daily.   pravastatin (PRAVACHOL) 80 MG tablet Take 80 mg by mouth daily.     PAST MEDICAL HISTORY: Past Medical History:  Diagnosis Date   Anxiety    Arthritis    Blood transfusion without reported diagnosis    as a newborn   GERD (gastroesophageal reflux disease)    Hypercholesterolemia    Hypertension  Reflux    Thyroid disease     PAST SURGICAL HISTORY: Past Surgical History:  Procedure Laterality Date   CARDIAC CATHETERIZATION     states no findings, in the 54s   WISDOM TOOTH EXTRACTION      FAMILY HISTORY: The patient family history includes Heart disease in his sister; Lung cancer in his mother; Pancreatic cancer in his father.  SOCIAL HISTORY:  The patient  reports that he has been smoking cigarettes. He has a 20.00 pack-year smoking history. He has never used smokeless tobacco. He  reports current alcohol use. He reports current drug use. Drug: Marijuana.  REVIEW OF SYSTEMS: Review of Systems  Constitutional: Negative for chills and fever.  HENT:  Negative for hoarse voice and nosebleeds.   Eyes:  Negative for discharge, double vision and pain.  Cardiovascular:  Negative for chest pain, claudication, dyspnea on exertion, leg swelling, near-syncope, orthopnea, palpitations, paroxysmal nocturnal dyspnea and syncope.  Respiratory:  Negative for hemoptysis, shortness of breath and snoring.   Musculoskeletal:  Negative for muscle cramps and myalgias.  Gastrointestinal:  Negative for abdominal pain, constipation, diarrhea, hematemesis, hematochezia, melena, nausea and vomiting.  Neurological:  Negative for dizziness and light-headedness.   PHYSICAL EXAM: Vitals with BMI 09/16/2021 08/31/2021 08/31/2021  Height 5\' 7"  - -  Weight 150 lbs 13 oz - 143 lbs 12 oz  BMI [id number] - [id number]  Systolic [id number]  Diastolic 77 91 82  Pulse 70 - 75    CONSTITUTIONAL: Well-developed and well-nourished. No acute distress.  SKIN: Skin is warm and dry. No rash noted. No cyanosis. No pallor. No jaundice HEAD: Normocephalic and atraumatic.  EYES: No scleral icterus MOUTH/THROAT: Moist oral membranes.  NECK: No JVD present. No thyromegaly noted. No carotid bruits  LYMPHATIC: No visible cervical adenopathy.  CHEST Normal respiratory effort. No intercostal retractions  LUNGS: Clear to auscultation bilaterally. No stridor. No wheezes. No rales.  CARDIOVASCULAR: Regular rate and rhythm, positive S1-S2, no murmurs rubs or gallops appreciated. ABDOMINAL: No apparent ascites.  EXTREMITIES: No peripheral edema  HEMATOLOGIC: No significant bruising NEUROLOGIC: Oriented to person, place, and time. Nonfocal. Normal muscle tone.  PSYCHIATRIC: Normal mood and affect. Normal behavior. Cooperative  CARDIAC DATABASE: EKG: 09/16/2021: NSR, 62 bpm, left axis deviation, T WI in inferior leads consider  inferior ischemia, without underlying injury pattern.  Echocardiogram: 11/26/2020: Normal LV systolic function with visual EF 55-60%. Left ventricle cavity is normal in size. Normal global wall motion. Normal diastolic filling pattern, normal LAP. Mild (Grade I) aortic regurgitation. Mild (Grade I) mitral regurgitation. No prior study for comparison.  08/31/2021: LVEF 55 to 60%, no regional wall motion abnormalities, normal right ventricular size and function, mild MR.  Stress Testing: 01/11/2021:  1 Day Rest/Stress Protocol. Patient exercised for 10 minutes and 29 seconds on Bruce protocol, achieved 12.58 METS, and 89% of age-predicted maximum heart rate. Stress ECG negative for ischemia. Overall normal myocardial perfusion, without convincing evidence of reversible myocardial ischemia or prior infarct. Left ventricular size: Dilated (EDV 149 cc). Left ventricular wall thickness is preserved without regional wall motion abnormalities. Calculated LVEF 41%. Low risk study.  CAC Score 03/09/2021: Total Agatston score: 7.8  [place] database percentile: 64 No acute or significant extracardiac abnormality.  Heart Catheterization: None  LABORATORY DATA: CBC Latest Ref Rng & Units 08/31/2021 08/30/2021 04/30/2017  WBC 4.0 - 10.5 K/uL 4.5 4.3 9.7  Hemoglobin 13.0 - 17.0 g/dL 13.4 14.4 14.5  Hematocrit 39.0 - 52.0 % 38.2(L) 41.2 42.5  Platelets 150 -  400 K/uL 84(L) 85(L) 168    CMP Latest Ref Rng & Units 08/31/2021 08/30/2021 04/30/2017  Glucose 70 - 99 mg/dL 83 98 161(W)  BUN 6 - 20 mg/dL 11 10 13   Creatinine 0.61 - 1.24 mg/dL 9.60 4.54  Sodium 135 - 145 mmol/L 137 134(L) 142  Potassium 3.5 - 5.1 mmol/L 5.1 2.7(LL) 3.3(L)  Chloride 98 - 111 mmol/L 104 100 106  CO2 22 - 32 mmol/L 23 24 26   Calcium 8.9 - 10.3 mg/dL 0.98) ) 8.9  Total Protein 6.5 - 8.1 g/dL - 6.0(L) -  Total Bilirubin 0.3 - 1.2 mg/dL - 0.7 -  Alkaline Phos 38 - 126 U/L - 39 -  AST 15 - 41 U/L - 50(H) -  ALT  0 - 44 U/L - 30 -    Lipid Panel     Component Value Date/Time   CHOL 103 08/31/2021 0338   TRIG 124 08/31/2021 0338   HDL 34 (L) 08/31/2021 0338   CHOLHDL 3.0 08/31/2021 0338   VLDL 25 08/31/2021 0338   LDLCALC 44 08/31/2021 0338   LDLDIRECT 52.3 08/31/2021 0338    No components found for: NTPROBNP No results for input(s): PROBNP in the last 8760 hours. Recent Labs    08/30/21 1812  TSH 3.081    BMP Recent Labs    08/30/21 1812 08/31/21 0338  NA 134* 137  K 2.7* 5.1  CL 100 104  CO2 24 23  GLUCOSE 98 83  BUN 10 11  CREATININE 0.76 0.91  CALCIUM 8.5* 8.5*  GFRNONAA >60 >60    HEMOGLOBIN A1C Lab Results  Component Value Date   HGBA1C 5.4 08/30/2021   MPG 108.28 08/30/2021     External Labs: Collected: 06/26/2020 TSH: 1.26  IMPRESSION:    ICD-10-CM   1. Abnormal electrocardiogram  R94.31 EKG 12-Lead    CT CORONARY MORPH W/CTA COR W/SCORE W/CA W/CM &/OR WO/CM    2. Agatston coronary artery calcium score less than 100  R93.1 CT CORONARY MORPH W/CTA COR W/SCORE W/CA W/CM &/OR WO/CM    3. Mild mitral and aortic regurgitation  I08.0     4. Benign hypertension  I10     5. Mixed hyperlipidemia  E78.2     6. Cigarette smoker  F17.210     7. Marijuana smoker  F12.90        RECOMMENDATIONS: JERAULD BOSTWICK is a 50 y.o. male whose past medical history and cardiac risk factors include: Hypertension, hyperlipidemia, mild coronary artery calcification, history of COVID-19 infection, former smoker (08/31/2021 hx of at least 40-year pack history), hx of marijuana smoking.  Patient was initially referred to the office for chest pain evaluation.  He underwent ischemic work-up at that time until recently in October 2022 he came in for a sick visit due to chest discomfort.  Due to his symptoms, EKG changes, the shared decision was to electively admit him to the hospital for further cardiac work-up.  During his hospitalization he was noted to have COVID-19 pneumonia  and was treated with supportive measures.  He did undergo an echocardiogram to reevaluate his LVEF.  Because his LVEF was stable he was discharged home and now presents for 2-week follow-up.  Currently he does not have any chest pain to suggest angina pectoris.  But his EKG continues to show T wave inversions in the inferior leads concerning for possible ischemia.  During his COVID-19 infection he had lateral EKG changes.  Given the EKG changes and the multiple  cardiovascular risk factors that shared decision at today's visit was to proceed with coronary CTA.   Since last office visit he has successfully stopped smoking as of 08/31/2021 along with complete cessation of marijuana.  He is hardly congratulated for these efforts and lifestyle changes.  Agatston coronary artery calcium score less than 100 Continue aspirin and statin therapy  Benign hypertension Office blood pressures within excellent control. Medications reconciled  Mixed hyperlipidemia Continue statin therapy   FINAL MEDICATION LIST END OF ENCOUNTER: No orders of the defined types were placed in this encounter.    Current Outpatient Medications:    ALPRAZolam (XANAX) 0.5 MG tablet, Take 0.5 mg by mouth at bedtime., Disp: , Rfl:    amLODipine (NORVASC) 5 MG tablet, TAKE ONE TABLET EVERY MORNING (Patient taking differently: Take 5 mg by mouth daily.), Disp: 30 tablet, Rfl: 0   aspirin EC 81 MG EC tablet, Take 1 tablet (81 mg total) by mouth daily. Swallow whole., Disp: 30 tablet, Rfl: 11   levothyroxine (SYNTHROID) 175 MCG tablet, Take 175 mcg by mouth daily., Disp: , Rfl:    losartan-hydrochlorothiazide (HYZAAR) 100-25 MG tablet, Take 1 tablet by mouth daily., Disp: , Rfl:    meloxicam (MOBIC) 15 MG tablet, Take 15 mg by mouth daily., Disp: , Rfl:    metoprolol tartrate (LOPRESSOR) 25 MG tablet, Take 1 tablet (25 mg total) by mouth 2 (two) times daily., Disp: 60 tablet, Rfl: 2   nitroGLYCERIN (NITROSTAT) 0.4 MG SL tablet,  Place 1 tablet (0.4 mg total) under the tongue every 5 (five) minutes x 3 doses as needed for chest pain., Disp: 30 tablet, Rfl: 0   omeprazole (PRILOSEC) 20 MG capsule, Take 20 mg by mouth daily., Disp: , Rfl: 3   pravastatin (PRAVACHOL) 80 MG tablet, Take 80 mg by mouth daily., Disp: , Rfl:   Orders Placed This Encounter  Procedures   CT CORONARY MORPH W/CTA COR W/SCORE W/CA W/CM &/OR WO/CM   EKG 12-Lead    There are no Patient Instructions on file for this visit.   --Continue cardiac medications as reconciled in final medication list. --Return in about 8 weeks (around 11/08/2021) for Follow up, Review test results. Or sooner if needed. --Continue follow-up with your primary care physician regarding the management of your other chronic comorbid conditions.  Patient's questions and concerns were addressed to his satisfaction. He voices understanding of the instructions provided during this encounter.   This note was created using a voice recognition software as a result there may be grammatical errors inadvertently enclosed that do not reflect the nature of this encounter. Every attempt is made to correct such errors.  Rex Kras, [place], [facility]  Pager: [phone] Office: [phone]

## 2021-09-28 ENCOUNTER — Ambulatory Visit (HOSPITAL_COMMUNITY): Admission: RE | Admit: 2021-09-28 | Payer: 59 | Source: Ambulatory Visit

## 2021-10-04 ENCOUNTER — Telehealth (HOSPITAL_COMMUNITY): Payer: Self-pay | Admitting: *Deleted

## 2021-10-04 ENCOUNTER — Other Ambulatory Visit (HOSPITAL_COMMUNITY): Payer: Self-pay | Admitting: *Deleted

## 2021-10-04 DIAGNOSIS — Z01812 Encounter for preprocedural laboratory examination: Secondary | ICD-10-CM

## 2021-10-04 NOTE — Telephone Encounter (Signed)
Reaching out to patient to offer assistance regarding upcoming cardiac imaging study; pt's wife verbalizes understanding of appt date/time, parking situation and where to check in, pre-test NPO status and medications ordered, and verified current allergies; name and call back number provided for further questions should they arise  Larey Brick RN Navigator Cardiac Imaging [facility] [phone] office [phone] cell  Patient wife verbalized understanding that patient will need labs prior to appointment and that he is to arrive at 1:30pm for his 2pm scan.

## 2021-10-05 ENCOUNTER — Other Ambulatory Visit (HOSPITAL_COMMUNITY)
Admission: RE | Admit: 2021-10-05 | Discharge: 2021-10-05 | Disposition: A | Discharge status: Home / Self Care | Payer: 59 | Source: Ambulatory Visit | Attending: Cardiology | Admitting: Cardiology

## 2021-10-05 DIAGNOSIS — Z01812 Encounter for preprocedural laboratory examination: Secondary | ICD-10-CM | POA: Insufficient documentation

## 2021-10-05 LAB — BASIC METABOLIC PANEL
Anion gap: 9 (ref 5–15)
BUN: 14 mg/dL (ref 6–20)
CO2: 24 mmol/L (ref 22–32)
Calcium: 8.6 mg/dL — ABNORMAL LOW (ref 8.9–10.3)
Chloride: 104 mmol/L (ref 98–111)
Creatinine, Ser: 0.89 mg/dL (ref 0.61–1.24)
GFR, Estimated: >60 mL/min (ref >60)
Glucose, Bld: 89 mg/dL (ref 70–99)
Potassium: 3.2 mmol/L — ABNORMAL LOW (ref 3.5–5.1)
Sodium: 137 mmol/L (ref 135–145)

## 2021-10-06 ENCOUNTER — Ambulatory Visit (HOSPITAL_COMMUNITY)
Admission: RE | Admit: 2021-10-06 | Discharge: 2021-10-06 | Disposition: A | Discharge status: Home / Self Care | Payer: 59 | Source: Ambulatory Visit | Attending: Cardiology | Admitting: Cardiology

## 2021-10-06 ENCOUNTER — Other Ambulatory Visit: Payer: Self-pay | Admitting: Cardiology

## 2021-10-06 ENCOUNTER — Other Ambulatory Visit: Payer: Self-pay

## 2021-10-06 DIAGNOSIS — R072 Precordial pain: Secondary | ICD-10-CM

## 2021-10-06 DIAGNOSIS — R9431 Abnormal electrocardiogram [ECG] [EKG]: Secondary | ICD-10-CM | POA: Diagnosis present

## 2021-10-06 DIAGNOSIS — R931 Abnormal findings on diagnostic imaging of heart and coronary circulation: Secondary | ICD-10-CM | POA: Insufficient documentation

## 2021-10-06 MED ORDER — IOHEXOL 350 MG/ML SOLN
95.0000 mL | Freq: Once | INTRAVENOUS | Status: AC | PRN
  Administered 2021-10-06: 95 mL via INTRAVENOUS

## 2021-10-06 MED ORDER — NITROGLYCERIN 0.4 MG SL SUBL
SUBLINGUAL_TABLET | SUBLINGUAL | Status: AC
  Filled 2021-10-06: qty 2

## 2021-10-06 MED ORDER — NITROGLYCERIN 0.4 MG SL SUBL
0.8000 mg | SUBLINGUAL_TABLET | Freq: Once | SUBLINGUAL | Status: AC
  Administered 2021-10-06: 0.8 mg via SUBLINGUAL

## 2021-10-08 ENCOUNTER — Other Ambulatory Visit: Payer: Self-pay

## 2021-10-08 DIAGNOSIS — R072 Precordial pain: Secondary | ICD-10-CM

## 2021-10-08 NOTE — Progress Notes (Signed)
Spoke to patient's wife she voiced understanding orders have been added and released, do you need rx sent in

## 2021-10-13 ENCOUNTER — Other Ambulatory Visit: Payer: Self-pay | Admitting: Cardiology

## 2021-10-13 DIAGNOSIS — E876 Hypokalemia: Secondary | ICD-10-CM

## 2021-10-13 MED ORDER — POTASSIUM CHLORIDE CRYS ER 20 MEQ PO TBCR: 20.0000 meq | EXTENDED_RELEASE_TABLET | Freq: Two times a day (BID) | ORAL | 0 refills | Status: DC

## 2021-10-15 NOTE — Progress Notes (Signed)
Spoke to patient's wife she voiced understanding

## 2021-10-22 ENCOUNTER — Other Ambulatory Visit: Payer: Self-pay

## 2021-10-22 ENCOUNTER — Other Ambulatory Visit (HOSPITAL_COMMUNITY)
Admission: RE | Admit: 2021-10-22 | Discharge: 2021-10-22 | Disposition: A | Discharge status: Home / Self Care | Payer: 59 | Source: Ambulatory Visit | Attending: Cardiology | Admitting: Cardiology

## 2021-10-22 DIAGNOSIS — R072 Precordial pain: Secondary | ICD-10-CM | POA: Diagnosis present

## 2021-10-22 LAB — COMPREHENSIVE METABOLIC PANEL
ALT: 55 U/L — ABNORMAL HIGH (ref 0–44)
AST: 38 U/L (ref 15–41)
Albumin: 4.2 g/dL (ref 3.5–5.0)
Alkaline Phosphatase: 63 U/L (ref 38–126)
Anion gap: 12 (ref 5–15)
BUN: 16 mg/dL (ref 6–20)
CO2: 23 mmol/L (ref 22–32)
Calcium: 9.3 mg/dL (ref 8.9–10.3)
Chloride: 104 mmol/L (ref 98–111)
Creatinine, Ser: 0.94 mg/dL (ref 0.61–1.24)
GFR, Estimated: >60 mL/min (ref >60)
Glucose, Bld: 106 mg/dL — ABNORMAL HIGH (ref 70–99)
Potassium: 3.7 mmol/L (ref 3.5–5.1)
Sodium: 139 mmol/L (ref 135–145)
Total Bilirubin: 0.6 mg/dL (ref 0.3–1.2)
Total Protein: 7.6 g/dL (ref 6.5–8.1)

## 2021-10-22 LAB — LDL CHOLESTEROL, DIRECT: Direct LDL: 111.2 mg/dL — ABNORMAL HIGH (ref 0–99)

## 2021-10-22 LAB — LIPID PANEL
Cholesterol: 181 mg/dL (ref 0–200)
HDL: 42 mg/dL (ref >40)
LDL Cholesterol: 99 mg/dL (ref 0–99)
Total CHOL/HDL Ratio: 4.3 RATIO
Triglycerides: 198 mg/dL — ABNORMAL HIGH (ref <150)
VLDL: 40 mg/dL (ref 0–40)

## 2021-10-29 NOTE — Progress Notes (Signed)
Pt's wife aware.

## 2021-11-02 NOTE — Progress Notes (Signed)
Spoke with patient wife, he will be here on his scheduled appointment.

## 2021-11-10 ENCOUNTER — Other Ambulatory Visit: Payer: Self-pay | Admitting: Cardiology

## 2021-11-11 ENCOUNTER — Other Ambulatory Visit: Payer: Self-pay

## 2021-11-11 ENCOUNTER — Encounter: Payer: Self-pay | Admitting: Cardiology

## 2021-11-11 ENCOUNTER — Ambulatory Visit: Payer: 59 | Admitting: Cardiology

## 2021-11-11 VITALS — BP 137/87 | HR 69 | Temp 98.8°F | Resp 16 | Ht 67.0 in | Wt 164.4 lb

## 2021-11-11 DIAGNOSIS — I10 Essential (primary) hypertension: Secondary | ICD-10-CM

## 2021-11-11 DIAGNOSIS — F1290 Cannabis use, unspecified, uncomplicated: Secondary | ICD-10-CM

## 2021-11-11 DIAGNOSIS — E782 Mixed hyperlipidemia: Secondary | ICD-10-CM

## 2021-11-11 DIAGNOSIS — Z8616 Personal history of COVID-19: Secondary | ICD-10-CM

## 2021-11-11 DIAGNOSIS — Z87891 Personal history of nicotine dependence: Secondary | ICD-10-CM

## 2021-11-11 DIAGNOSIS — R931 Abnormal findings on diagnostic imaging of heart and coronary circulation: Secondary | ICD-10-CM

## 2021-11-11 DIAGNOSIS — I2510 Atherosclerotic heart disease of native coronary artery without angina pectoris: Secondary | ICD-10-CM

## 2021-11-11 NOTE — Progress Notes (Signed)
Date:  11/11/2021   ID:  Christian Lynch, DOB [birth year], MRN [id number]  PCP:  Bridget Hartshorn, NP  Cardiologist:  Rex Kras, DO, [facility] (established care November 10, 2020)  Date: 11/11/21 Last Office Visit: 09/16/2021  Chief Complaint  Patient presents with   Results   Follow-up   Chest Pain    HPI  Christian Lynch is a 51 y.o. male who presents to the office with a chief complaint of " reevaluation of chest pain and discuss test results." Patient's past medical history and cardiovascular risk factors include: COVID 19 Pneumonia, Hypertension, hyperlipidemia, mild coronary artery calcification, history of COVID-19 infection, former smoker (08/31/2021 hx of at least 40-year pack history), hx of marijuana smoking.  He is referred to the office at the request of Hemberg, Karie Schwalbe, NP for evaluation of chest pain.  Patient is accompanied by his wife Christian Lynch at today's visit.    In October 2022 patient came in with acute onset of chest pain with EKG changes concerning for ACS.  He went to [facility] for further evaluation and management.  He was noted to be COVID-positive and a surface echocardiogram noted preserved LVEF and therefore no additional testing was pursued during that hospitalization until resolution of COVID-19.  Since then patient underwent a coronary CTA results as well as the images reviewed both the patient and his wife at today's office visit.  CT FFR did not illustrate any hemodynamically significant stenosis. Recent lab work from 10/22/2021 notes worsening LDL and triglyceride levels.  Patient is contributing the uptrend in LDL and triglyceride levels due to recent holidays and weight gain secondary to stopping smoking.  Patient is congratulated on his continued efforts with regards to complete smoking cessation.  Clinically he denies any anginal discomfort or heart failure symptoms.  FUNCTIONAL STATUS: No structured exercise program or daily routine.    ALLERGIES: Allergies  Allergen Reactions   Clarithromycin Other (See Comments)    Caused stomach bleeding   Morphine And Related Nausea And Vomiting    hypertension   Zofran [Ondansetron Hcl] Nausea And Vomiting   Hydrocodone Rash   Oxycodone Rash    MEDICATION LIST PRIOR TO VISIT: Current Meds  Medication Sig   ALPRAZolam (XANAX) 0.5 MG tablet Take 0.5 mg by mouth at bedtime.   amLODipine (NORVASC) 5 MG tablet TAKE ONE TABLET EVERY MORNING (Patient taking differently: Take 5 mg by mouth daily.)   aspirin EC 81 MG EC tablet Take 1 tablet (81 mg total) by mouth daily. Swallow whole.   levothyroxine (SYNTHROID) 175 MCG tablet Take 175 mcg by mouth daily.   losartan-hydrochlorothiazide (HYZAAR) 100-25 MG tablet Take 1 tablet by mouth daily.   meloxicam (MOBIC) 15 MG tablet Take 15 mg by mouth daily.   metoprolol tartrate (LOPRESSOR) 25 MG tablet TAKE ONE (1) TABLET BY MOUTH TWO (2) TIMES DAILY   nitroGLYCERIN (NITROSTAT) 0.4 MG SL tablet Place 1 tablet (0.4 mg total) under the tongue every 5 (five) minutes x 3 doses as needed for chest pain.   omeprazole (PRILOSEC) 20 MG capsule Take 20 mg by mouth daily.   potassium chloride SA (KLOR-CON M) 20 MEQ tablet Take 1 tablet (20 mEq total) by mouth 2 (two) times daily.   pravastatin (PRAVACHOL) 80 MG tablet Take 80 mg by mouth daily.     PAST MEDICAL HISTORY: Past Medical History:  Diagnosis Date   Anxiety    Arthritis    Blood transfusion without reported diagnosis  as a newborn   GERD (gastroesophageal reflux disease)    Hypercholesterolemia    Hypertension    Reflux    Thyroid disease     PAST SURGICAL HISTORY: Past Surgical History:  Procedure Laterality Date   CARDIAC CATHETERIZATION     states no findings, in the 21s   WISDOM TOOTH EXTRACTION      FAMILY HISTORY: The patient family history includes Heart disease in his sister; Lung cancer in his mother; Pancreatic cancer in his father.  SOCIAL HISTORY:  The  patient  reports that he quit smoking about 3 months ago. His smoking use included cigarettes. He has a 20.00 pack-year smoking history. He has never used smokeless tobacco. He reports current alcohol use. He reports current drug use. Drug: Marijuana.  REVIEW OF SYSTEMS: Review of Systems  Constitutional: Positive for weight gain. Negative for chills and fever.  HENT:  Negative for hoarse voice and nosebleeds.   Eyes:  Negative for discharge, double vision and pain.  Cardiovascular:  Negative for chest pain, claudication, dyspnea on exertion, leg swelling, near-syncope, orthopnea, palpitations, paroxysmal nocturnal dyspnea and syncope.  Respiratory:  Negative for hemoptysis, shortness of breath and snoring.   Musculoskeletal:  Negative for muscle cramps and myalgias.  Gastrointestinal:  Negative for abdominal pain, constipation, diarrhea, hematemesis, hematochezia, melena, nausea and vomiting.  Neurological:  Negative for dizziness and light-headedness.   PHYSICAL EXAM: Vitals with BMI 11/11/2021 11/11/2021 10/06/2021  Height - 5' 7"  -  Weight - 164 lbs 6 oz -  BMI - 85.63 -  Systolic 149 702 -  Diastolic 87 90 -  Pulse 69 70 (No Data)    CONSTITUTIONAL: Well-developed and well-nourished. No acute distress.  SKIN: Skin is warm and dry. No rash noted. No cyanosis. No pallor. No jaundice HEAD: Normocephalic and atraumatic.  EYES: No scleral icterus MOUTH/THROAT: Moist oral membranes.  NECK: No JVD present. No thyromegaly noted. No carotid bruits  LYMPHATIC: No visible cervical adenopathy.  CHEST Normal respiratory effort. No intercostal retractions  LUNGS: Clear to auscultation bilaterally. No stridor. No wheezes. No rales.  CARDIOVASCULAR: Regular rate and rhythm, positive S1-S2, no murmurs rubs or gallops appreciated. ABDOMINAL: No apparent ascites.  EXTREMITIES: No peripheral edema  HEMATOLOGIC: No significant bruising NEUROLOGIC: Oriented to person, place, and time. Nonfocal.  Normal muscle tone.  PSYCHIATRIC: Normal mood and affect. Normal behavior. Cooperative  CARDIAC DATABASE: EKG: 09/16/2021: NSR, 62 bpm, left axis deviation, T WI in inferior leads consider inferior ischemia, without underlying injury pattern.  Echocardiogram: 11/26/2020: Normal LV systolic function with visual EF 55-60%. Left ventricle cavity is normal in size. Normal global wall motion. Normal diastolic filling pattern, normal LAP. Mild (Grade I) aortic regurgitation. Mild (Grade I) mitral regurgitation. No prior study for comparison.  08/31/2021: LVEF 55 to 60%, no regional wall motion abnormalities, normal right ventricular size and function, mild MR.  Stress Testing: 01/11/2021:  1 Day Rest/Stress Protocol. Patient exercised for 10 minutes and 29 seconds on Bruce protocol, achieved 12.58 METS, and 89% of age-predicted maximum heart rate. Stress ECG negative for ischemia. Overall normal myocardial perfusion, without convincing evidence of reversible myocardial ischemia or prior infarct. Left ventricular size: Dilated (EDV 149 cc). Left ventricular wall thickness is preserved without regional wall motion abnormalities. Calculated LVEF 41%. Low risk study.  CAC Score 03/09/2021: Total Agatston score: 7.8  [place] database percentile: 64 No acute or significant extracardiac abnormality.  Heart Catheterization: None  CCTA 10/06/2021: 1. Total coronary calcium score of 6.03. This was [employer]  percentile for age and sex matched control.   2. Normal coronary origin with co-dominance.   3. CAD-RADS = 3. Left Main: Patent.  LAD: Mild stenosis (25-49%) due to non-calcified plaque at mid/distal LAD, otherwise the vessel is patent.   LCX: Moderate stenosis (50-69%) closer to 50% due to mixed plaque at the mid/distal LCx (after the second OM); otherwise the vessel is patent.   RCA: Mild stenosis (25-49%) due to non-calcified plaque at mid/distal RCA, otherwise vessel is patent.   4.  Aortic atherosclerosis.   5. Study is sent for CT-FFR findings will be performed and reported separately.  6. No acute or significant extracardiac abnormality.  CT-FFR December 2022: CT FFR analysis showed no significant stenosis.  LABORATORY DATA: CBC Latest Ref Rng & Units 08/31/2021 08/30/2021 04/30/2017  WBC 4.0 - 10.5 K/uL 4.5 4.3 9.7  Hemoglobin 13.0 - 17.0 g/dL 13.4 14.4 14.5  Hematocrit 39.0 - 52.0 % 38.2(L) 41.2 42.5  Platelets 150 - 400 K/uL 84(L) 85(L) 168    CMP Latest Ref Rng & Units 10/22/2021 10/05/2021 08/31/2021  Glucose 70 - 99 mg/dL 106(H) 89 83  BUN 6 - 20 mg/dL 16 14 11   Creatinine 0.61 - 1.24 mg/dL 0.94 0.89 0.91  Sodium 135 - 145 mmol/L 139 137 137  Potassium 3.5 - 5.1 mmol/L 3.7 3.2(L) 5.1  Chloride 98 - 111 mmol/L 104 104 104  CO2 22 - 32 mmol/L 23 24 23   Calcium 8.9 - 10.3 mg/dL 9.3 8.6(L) 8.5(L)  Total Protein 6.5 - 8.1 g/dL 7.6 - -  Total Bilirubin 0.3 - 1.2 mg/dL 0.6 - -  Alkaline Phos 38 - 126 U/L 63 - -  AST 15 - 41 U/L 38 - -  ALT 0 - 44 U/L 55(H) - -    Lipid Panel     Component Value Date/Time   CHOL 181 10/22/2021 1014   TRIG 198 (H) 10/22/2021 1014   HDL 42 10/22/2021 1014   CHOLHDL 4.3 10/22/2021 1014   VLDL 40 10/22/2021 1014   LDLCALC 99 10/22/2021 1014   LDLDIRECT 111.2 (H) 10/22/2021 1014    No components found for: NTPROBNP No results for input(s): PROBNP in the last 8760 hours. Recent Labs    08/30/21 1812  TSH 3.081    BMP Recent Labs    08/31/21 0338 10/05/21 1548 10/22/21 1014  NA 137 137 139  K 5.1 3.2* 3.7  CL 104 104 104  CO2 23 24 23   GLUCOSE 83 89 106*  BUN 11 14 16   CREATININE 0.91 0.89 0.94  CALCIUM 8.5* 8.6* 9.3  GFRNONAA >60 >60 >60    HEMOGLOBIN A1C Lab Results  Component Value Date   HGBA1C 5.4 08/30/2021   MPG 108.28 08/30/2021     External Labs: Collected: 06/26/2020 TSH: 1.26  IMPRESSION:    ICD-10-CM   1. Mixed hyperlipidemia  E78.2 Lipid Panel With LDL/HDL Ratio    LDL  cholesterol, direct    CMP14+EGFR    2. Nonobstructive atherosclerosis of coronary artery  I25.10     3. Agatston coronary artery calcium score less than 100  R93.1     4. Benign hypertension  I10     5. History of COVID-19  Z86.16     6. Former smoker  Z87.891     68. Marijuana smoker  F12.90        RECOMMENDATIONS: Christian Lynch is a 51 y.o. male whose past medical history and cardiac risk factors include: Hypertension, hyperlipidemia, mild coronary  artery calcification, history of COVID-19 infection, former smoker (08/31/2021 hx of at least 40-year pack history), hx of marijuana smoking.  Hypertriglyceridemia /hyperlipidemia: Most likely secondary to recent weight gain of approximately 14 pounds in the last office visit and dietary indiscretion. Shared decision was to reevaluate fasting lipid profile in 6 weeks. Continue pravastatin 80 mg p.o. nightly.  Nonobstructive atherosclerosis of coronary artery, mild coronary artery calcification Total CAC 6.03, 59th percentile Recently had an echocardiogram and coronary CTA results reviewed with the patient and his wife in great detail and noted above for further reference. Recommend improving his modifiable cardiovascular risk factors for secondary prevention.  Benign hypertension Office blood pressures are well controlled. Medications reconciled. Currently managed by primary care provider.  Former smoker Patient quit smoking 08/2021 Educated on the importance of complete smoking cessation going forward.  Marijuana smoker Educated on the importance of complete cessation of marijuana use as well.  FINAL MEDICATION LIST END OF ENCOUNTER: No orders of the defined types were placed in this encounter.     Current Outpatient Medications:    ALPRAZolam (XANAX) 0.5 MG tablet, Take 0.5 mg by mouth at bedtime., Disp: , Rfl:    amLODipine (NORVASC) 5 MG tablet, TAKE ONE TABLET EVERY MORNING (Patient taking differently: Take 5 mg by  mouth daily.), Disp: 30 tablet, Rfl: 0   aspirin EC 81 MG EC tablet, Take 1 tablet (81 mg total) by mouth daily. Swallow whole., Disp: 30 tablet, Rfl: 11   levothyroxine (SYNTHROID) 175 MCG tablet, Take 175 mcg by mouth daily., Disp: , Rfl:    losartan-hydrochlorothiazide (HYZAAR) 100-25 MG tablet, Take 1 tablet by mouth daily., Disp: , Rfl:    meloxicam (MOBIC) 15 MG tablet, Take 15 mg by mouth daily., Disp: , Rfl:    metoprolol tartrate (LOPRESSOR) 25 MG tablet, TAKE ONE (1) TABLET BY MOUTH TWO (2) TIMES DAILY, Disp: 60 tablet, Rfl: 2   nitroGLYCERIN (NITROSTAT) 0.4 MG SL tablet, Place 1 tablet (0.4 mg total) under the tongue every 5 (five) minutes x 3 doses as needed for chest pain., Disp: 30 tablet, Rfl: 0   omeprazole (PRILOSEC) 20 MG capsule, Take 20 mg by mouth daily., Disp: , Rfl: 3   potassium chloride SA (KLOR-CON M) 20 MEQ tablet, Take 1 tablet (20 mEq total) by mouth 2 (two) times daily., Disp: 180 tablet, Rfl: 0   pravastatin (PRAVACHOL) 80 MG tablet, Take 80 mg by mouth daily., Disp: , Rfl:   Orders Placed This Encounter  Procedures   Lipid Panel With LDL/HDL Ratio   LDL cholesterol, direct   CMP14+EGFR    There are no Patient Instructions on file for this visit.   --Continue cardiac medications as reconciled in final medication list. --Return in about 7 weeks (around 12/30/2021) for Follow up, Lipid. Or sooner if needed. --Continue follow-up with your primary care physician regarding the management of your other chronic comorbid conditions.  Patient's questions and concerns were addressed to his satisfaction. He voices understanding of the instructions provided during this encounter.   This note was created using a voice recognition software as a result there may be grammatical errors inadvertently enclosed that do not reflect the nature of this encounter. Every attempt is made to correct such errors.  Rex Kras, [place], [facility]  Pager: [phone] Office: [phone]

## 2021-11-19 ENCOUNTER — Other Ambulatory Visit: Payer: Self-pay

## 2021-11-19 DIAGNOSIS — E782 Mixed hyperlipidemia: Secondary | ICD-10-CM

## 2021-12-09 ENCOUNTER — Other Ambulatory Visit: Payer: Self-pay | Admitting: Cardiology

## 2021-12-09 DIAGNOSIS — E876 Hypokalemia: Secondary | ICD-10-CM

## 2021-12-14 ENCOUNTER — Ambulatory Visit: Payer: 59 | Admitting: Cardiology

## 2021-12-27 ENCOUNTER — Other Ambulatory Visit (HOSPITAL_COMMUNITY)
Admission: RE | Admit: 2021-12-27 | Discharge: 2021-12-27 | Disposition: A | Discharge status: Home / Self Care | Payer: 59 | Source: Ambulatory Visit | Attending: Cardiology | Admitting: Cardiology

## 2021-12-27 DIAGNOSIS — E782 Mixed hyperlipidemia: Secondary | ICD-10-CM | POA: Insufficient documentation

## 2021-12-27 LAB — LIPID PANEL
Cholesterol: 160 mg/dL (ref 0–200)
HDL: 35 mg/dL — ABNORMAL LOW (ref >40)
LDL Cholesterol: 72 mg/dL (ref 0–99)
Total CHOL/HDL Ratio: 4.6 RATIO
Triglycerides: 264 mg/dL — ABNORMAL HIGH (ref <150)
VLDL: 53 mg/dL — ABNORMAL HIGH (ref 0–40)

## 2021-12-27 LAB — COMPREHENSIVE METABOLIC PANEL
ALT: 32 U/L (ref 0–44)
AST: 29 U/L (ref 15–41)
Albumin: 4.4 g/dL (ref 3.5–5.0)
Alkaline Phosphatase: 60 U/L (ref 38–126)
Anion gap: 6 (ref 5–15)
BUN: 12 mg/dL (ref 6–20)
CO2: 26 mmol/L (ref 22–32)
Calcium: 9 mg/dL (ref 8.9–10.3)
Chloride: 104 mmol/L (ref 98–111)
Creatinine, Ser: 0.93 mg/dL (ref 0.61–1.24)
GFR, Estimated: >60 mL/min (ref >60)
Glucose, Bld: 90 mg/dL (ref 70–99)
Potassium: 3.4 mmol/L — ABNORMAL LOW (ref 3.5–5.1)
Sodium: 136 mmol/L (ref 135–145)
Total Bilirubin: 0.5 mg/dL (ref 0.3–1.2)
Total Protein: 7.4 g/dL (ref 6.5–8.1)

## 2021-12-28 LAB — LDL CHOLESTEROL, DIRECT: Direct LDL: 82.1 mg/dL (ref 0–99)

## 2021-12-30 ENCOUNTER — Ambulatory Visit: Payer: 59 | Admitting: Cardiology

## 2021-12-31 NOTE — Progress Notes (Signed)
Spoke to patient wife she voiced understanding and will make patient aware

## 2022-01-10 ENCOUNTER — Encounter: Payer: Self-pay | Admitting: Cardiology

## 2022-01-10 ENCOUNTER — Ambulatory Visit: Payer: 59 | Admitting: Cardiology

## 2022-01-10 ENCOUNTER — Other Ambulatory Visit: Payer: Self-pay

## 2022-01-10 VITALS — BP 149/90 | HR 66 | Temp 97.8°F | Resp 16 | Ht 67.0 in | Wt 165.0 lb

## 2022-01-10 DIAGNOSIS — Z8616 Personal history of COVID-19: Secondary | ICD-10-CM

## 2022-01-10 DIAGNOSIS — I10 Essential (primary) hypertension: Secondary | ICD-10-CM

## 2022-01-10 DIAGNOSIS — Z87891 Personal history of nicotine dependence: Secondary | ICD-10-CM

## 2022-01-10 DIAGNOSIS — E781 Pure hyperglyceridemia: Secondary | ICD-10-CM

## 2022-01-10 DIAGNOSIS — E782 Mixed hyperlipidemia: Secondary | ICD-10-CM

## 2022-01-10 DIAGNOSIS — R931 Abnormal findings on diagnostic imaging of heart and coronary circulation: Secondary | ICD-10-CM

## 2022-01-10 DIAGNOSIS — I2510 Atherosclerotic heart disease of native coronary artery without angina pectoris: Secondary | ICD-10-CM

## 2022-01-10 NOTE — Progress Notes (Signed)
Date:  01/10/2022   ID:  PAO HAFFEY, DOB Feb 10, 1971, MRN [id number]  PCP:  Bridget Hartshorn, NP  Cardiologist:  Rex Kras, DO, [facility] (established care November 10, 2020)  Date: 01/10/22 Last Office Visit: 11/11/2021  Chief Complaint  Patient presents with   Hyperlipidemia   Follow-up    7 weeks    HPI  Christian Lynch is a 51 y.o. male who presents to the office with a chief complaint of " hyperlipidemia/hypertriglyceridemia management." Patient's past medical history and cardiovascular risk factors include: COVID 19 Pneumonia, Hypertension, hyperlipidemia, mild coronary artery calcification, history of COVID-19 infection, former smoker (08/31/2021 hx of at least 40-year pack history), hx of marijuana smoking.  He is referred to the office at the request of Hemberg, Karie Schwalbe, NP for evaluation of chest pain.  Patient had episode back in October 2022 when he had chest discomfort concerning for possible ACS.  He was electively admitted to the hospital and was found to have COVID-19 infection.  After the resolution of COVID-19 he underwent a coronary CTA and was noted to have mild coronary artery calcification and no hemodynamically significant stenosis.  He was educated on improving his modifiable cardiovascular risk factors.  As of November 2022 patient stopped smoking.  And during his last follow-up visit was noted to have increase in weight and uncontrolled lipid panel.  The shared decision was to implement lifestyle changes and to reevaluate his lipids in 6 weeks.  Most recent lipid profile from December 27, 2021 independently reviewed with the patient.  Triglycerides are not well controlled.  Upon further questioning patient states that he did not fast prior to this blood work.  FUNCTIONAL STATUS: No structured exercise program or daily routine.   ALLERGIES: Allergies  Allergen Reactions   Clarithromycin Other (See Comments)    Caused stomach bleeding   Morphine And  Related Nausea And Vomiting    hypertension   Zofran [Ondansetron Hcl] Nausea And Vomiting   Hydrocodone Rash   Oxycodone Rash    MEDICATION LIST PRIOR TO VISIT: Current Meds  Medication Sig   ALPRAZolam (XANAX) 0.5 MG tablet Take 0.5 mg by mouth at bedtime.   amLODipine (NORVASC) 5 MG tablet TAKE ONE TABLET EVERY MORNING (Patient taking differently: Take 5 mg by mouth daily.)   aspirin EC 81 MG EC tablet Take 1 tablet (81 mg total) by mouth daily. Swallow whole.   levothyroxine (SYNTHROID) 175 MCG tablet Take 175 mcg by mouth daily.   losartan-hydrochlorothiazide (HYZAAR) 100-25 MG tablet Take 1 tablet by mouth daily.   meloxicam (MOBIC) 15 MG tablet Take 15 mg by mouth daily.   metoprolol tartrate (LOPRESSOR) 25 MG tablet TAKE ONE (1) TABLET BY MOUTH TWO (2) TIMES DAILY   nitroGLYCERIN (NITROSTAT) 0.4 MG SL tablet Place 1 tablet (0.4 mg total) under the tongue every 5 (five) minutes x 3 doses as needed for chest pain.   omeprazole (PRILOSEC) 20 MG capsule Take 20 mg by mouth daily.   potassium chloride SA (KLOR-CON M) 20 MEQ tablet TAKE ONE (1) TABLET BY MOUTH TWO (2) TIMES DAILY   pravastatin (PRAVACHOL) 80 MG tablet Take 80 mg by mouth daily.     PAST MEDICAL HISTORY: Past Medical History:  Diagnosis Date   Anxiety    Arthritis    Blood transfusion without reported diagnosis    as a newborn   GERD (gastroesophageal reflux disease)    Hypercholesterolemia    Hypertension    Reflux  Thyroid disease     PAST SURGICAL HISTORY: Past Surgical History:  Procedure Laterality Date   CARDIAC CATHETERIZATION     states no findings, in the 53s   WISDOM TOOTH EXTRACTION      FAMILY HISTORY: The patient family history includes Heart disease in his sister; Lung cancer in his mother; Pancreatic cancer in his father.  SOCIAL HISTORY:  The patient  reports that he quit smoking about 5 months ago. His smoking use included cigarettes. He has a 20.00 pack-year smoking history. He  has never used smokeless tobacco. He reports current alcohol use. He reports current drug use. Drug: Marijuana.  REVIEW OF SYSTEMS: Review of Systems  Constitutional: Negative for chills, fever and weight gain.  HENT:  Negative for hoarse voice and nosebleeds.   Eyes:  Negative for discharge, double vision and pain.  Cardiovascular:  Negative for chest pain, claudication, dyspnea on exertion, leg swelling, near-syncope, orthopnea, palpitations, paroxysmal nocturnal dyspnea and syncope.  Respiratory:  Negative for hemoptysis, shortness of breath and snoring.   Musculoskeletal:  Negative for muscle cramps and myalgias.  Gastrointestinal:  Negative for abdominal pain, constipation, diarrhea, hematemesis, hematochezia, melena, nausea and vomiting.  Neurological:  Negative for dizziness and light-headedness.   PHYSICAL EXAM: Vitals with BMI 01/10/2022 11/11/2021 11/11/2021  Height 5' 7"  - 5' 7"   Weight 165 lbs - 164 lbs 6 oz  BMI 01.77 - 93.90  Systolic 300 923 300  Diastolic 90 87 90  Pulse 66 69 70    CONSTITUTIONAL: Well-developed and well-nourished. No acute distress.  SKIN: Skin is warm and dry. No rash noted. No cyanosis. No pallor. No jaundice HEAD: Normocephalic and atraumatic.  EYES: No scleral icterus MOUTH/THROAT: Moist oral membranes.  NECK: No JVD present. No thyromegaly noted. No carotid bruits  LYMPHATIC: No visible cervical adenopathy.  CHEST Normal respiratory effort. No intercostal retractions  LUNGS: Clear to auscultation bilaterally. No stridor. No wheezes. No rales.  CARDIOVASCULAR: Regular rate and rhythm, positive S1-S2, no murmurs rubs or gallops appreciated. ABDOMINAL: No apparent ascites.  EXTREMITIES: No peripheral edema  HEMATOLOGIC: No significant bruising NEUROLOGIC: Oriented to person, place, and time. Nonfocal. Normal muscle tone.  PSYCHIATRIC: Normal mood and affect. Normal behavior. Cooperative  No change in physical examination since last office  encounter  CARDIAC DATABASE: EKG: 09/16/2021: NSR, 62 bpm, left axis deviation, T WI in inferior leads consider inferior ischemia, without underlying injury pattern.  Echocardiogram: 11/26/2020: Normal LV systolic function with visual EF 55-60%. Left ventricle cavity is normal in size. Normal global wall motion. Normal diastolic filling pattern, normal LAP. Mild (Grade I) aortic regurgitation. Mild (Grade I) mitral regurgitation. No prior study for comparison.  08/31/2021: LVEF 55 to 60%, no regional wall motion abnormalities, normal right ventricular size and function, mild MR.  Stress Testing: 01/11/2021:  1 Day Rest/Stress Protocol. Patient exercised for 10 minutes and 29 seconds on Bruce protocol, achieved 12.58 METS, and 89% of age-predicted maximum heart rate. Stress ECG negative for ischemia. Overall normal myocardial perfusion, without convincing evidence of reversible myocardial ischemia or prior infarct. Left ventricular size: Dilated (EDV 149 cc). Left ventricular wall thickness is preserved without regional wall motion abnormalities. Calculated LVEF 41%. Low risk study.  CAC Score 03/09/2021: Total Agatston score: 7.8  [place] database percentile: 64 No acute or significant extracardiac abnormality.  Heart Catheterization: None  CCTA 10/06/2021: 1. Total coronary calcium score of 6.03. This was 59th percentile for age and sex matched control.   2. Normal coronary origin with co-dominance.  3. CAD-RADS = 3. Left Main: Patent.  LAD: Mild stenosis (25-49%) due to non-calcified plaque at mid/distal LAD, otherwise the vessel is patent.   LCX: Moderate stenosis (50-69%) closer to 50% due to mixed plaque at the mid/distal LCx (after the second OM); otherwise the vessel is patent.   RCA: Mild stenosis (25-49%) due to non-calcified plaque at mid/distal RCA, otherwise vessel is patent.   4. Aortic atherosclerosis.   5. Study is sent for CT-FFR findings will be  performed and reported separately.  6. No acute or significant extracardiac abnormality.  CT-FFR December 2022: CT FFR analysis showed no significant stenosis.  LABORATORY DATA: CBC Latest Ref Rng & Units 08/31/2021 08/30/2021 04/30/2017  WBC 4.0 - 10.5 K/uL 4.5 4.3 9.7  Hemoglobin 13.0 - 17.0 g/dL 13.4 14.4 14.5  Hematocrit 39.0 - 52.0 % 38.2(L) 41.2 42.5  Platelets 150 - 400 K/uL 84(L) 85(L) 168    CMP Latest Ref Rng & Units 12/27/2021 10/22/2021 10/05/2021  Glucose 70 - 99 mg/dL 90 106(H) 89  BUN 6 - 20 mg/dL 12 16 14   Creatinine 0.61 - 1.24 mg/dL 0.93 0.94 0.89  Sodium 135 - 145 mmol/L 136 139 137  Potassium 3.5 - 5.1 mmol/L 3.4(L) 3.7 3.2(L)  Chloride 98 - 111 mmol/L 104 104 104  CO2 22 - 32 mmol/L 26 23 24   Calcium 8.9 - 10.3 mg/dL 9.0 9.3 8.6(L)  Total Protein 6.5 - 8.1 g/dL 7.4 7.6 -  Total Bilirubin 0.3 - 1.2 mg/dL 0.5 0.6 -  Alkaline Phos 38 - 126 U/L 60 63 -  AST 15 - 41 U/L 29 38 -  ALT 0 - 44 U/L 32 55(H) -    Lipid Panel     Component Value Date/Time   CHOL 160 12/27/2021 1544   TRIG 264 (H) 12/27/2021 1544   HDL 35 (L) 12/27/2021 1544   CHOLHDL 4.6 12/27/2021 1544   VLDL 53 (H) 12/27/2021 1544   LDLCALC 72 12/27/2021 1544   LDLDIRECT 82.1 12/27/2021 1543    No components found for: NTPROBNP No results for input(s): PROBNP in the last 8760 hours. Recent Labs    08/30/21 1812  TSH 3.081    BMP Recent Labs    10/05/21 1548 10/22/21 1014 12/27/21 1543  NA 137 139 136  K 3.2* 3.7 3.4*  CL 104 104 104  CO2 24 23 26   GLUCOSE 89 106* 90  BUN 14 16 12   CREATININE 0.89 0.94 0.93  CALCIUM 8.6* 9.3 9.0  GFRNONAA >60 >60 >60    HEMOGLOBIN A1C Lab Results  Component Value Date   HGBA1C 5.4 08/30/2021   MPG 108.28 08/30/2021     External Labs: Collected: 06/26/2020 TSH: 1.26  IMPRESSION:    ICD-10-CM   1. Hypertriglyceridemia  E78.1 Lipid Panel With LDL/HDL Ratio    LDL cholesterol, direct    CMP14+EGFR    2. Mixed hyperlipidemia  E78.2      3. Nonobstructive atherosclerosis of coronary artery  I25.10     4. Agatston coronary artery calcium score less than 100  R93.1     5. Benign hypertension  I10     6. History of COVID-19  Z86.16     7. Former smoker  Z87.891         RECOMMENDATIONS: Christian Lynch is a 51 y.o. male whose past medical history and cardiac risk factors include: Hypertension, hyperlipidemia, mild coronary artery calcification, history of COVID-19 infection, former smoker (08/31/2021 hx of at least 40-year pack history),  hx of marijuana smoking.  Hypertriglyceridemia / Mixed hyperlipidemia Most of the lipid profile from December 27, 2021 independently reviewed.  Triglycerides are not well controlled but this was a nonfasting lab.  We will recheck fasting lipid profile and CMP prior to considering pharmacological therapy. LDL within acceptable range. Continue pravastatin 80 mg p.o. nightly. Does not endorse myalgias.  Nonobstructive atherosclerosis of coronary artery / Agatston coronary artery calcium score less than 100 Total CAC 6.03, 59th percentile. Anginal free. No use of sublingual nitroglycerin tablets. Reviewed coronary CTA results as part of today's office visit -noted above for further reference. Educated on the importance of secondary prevention No additional cardiovascular testing warranted at this time.  Benign hypertension Office blood pressures are not well controlled. Encouraged to check his home blood pressures and to review with PCP to see if additional medication titration is warranted. Recommended the importance of a low-salt diet.  Former smoker Quit smoking November 2022 Reemphasized importance of complete smoking cessation.  During today's office visit discussed management of at least 2 chronic comorbid conditions, independently reviewed labs from February 2023, and reviewed the results of the echo and coronary CTA as part of medical decision making during today's  encounter.  Ordered additional diagnostic testing to reevaluate lipids.  FINAL MEDICATION LIST END OF ENCOUNTER: No orders of the defined types were placed in this encounter.     Current Outpatient Medications:    ALPRAZolam (XANAX) 0.5 MG tablet, Take 0.5 mg by mouth at bedtime., Disp: , Rfl:    amLODipine (NORVASC) 5 MG tablet, TAKE ONE TABLET EVERY MORNING (Patient taking differently: Take 5 mg by mouth daily.), Disp: 30 tablet, Rfl: 0   aspirin EC 81 MG EC tablet, Take 1 tablet (81 mg total) by mouth daily. Swallow whole., Disp: 30 tablet, Rfl: 11   levothyroxine (SYNTHROID) 175 MCG tablet, Take 175 mcg by mouth daily., Disp: , Rfl:    losartan-hydrochlorothiazide (HYZAAR) 100-25 MG tablet, Take 1 tablet by mouth daily., Disp: , Rfl:    meloxicam (MOBIC) 15 MG tablet, Take 15 mg by mouth daily., Disp: , Rfl:    metoprolol tartrate (LOPRESSOR) 25 MG tablet, TAKE ONE (1) TABLET BY MOUTH TWO (2) TIMES DAILY, Disp: 60 tablet, Rfl: 2   nitroGLYCERIN (NITROSTAT) 0.4 MG SL tablet, Place 1 tablet (0.4 mg total) under the tongue every 5 (five) minutes x 3 doses as needed for chest pain., Disp: 30 tablet, Rfl: 0   omeprazole (PRILOSEC) 20 MG capsule, Take 20 mg by mouth daily., Disp: , Rfl: 3   potassium chloride SA (KLOR-CON M) 20 MEQ tablet, TAKE ONE (1) TABLET BY MOUTH TWO (2) TIMES DAILY, Disp: 180 tablet, Rfl: 0   pravastatin (PRAVACHOL) 80 MG tablet, Take 80 mg by mouth daily., Disp: , Rfl:   Orders Placed This Encounter  Procedures   Lipid Panel With LDL/HDL Ratio   LDL cholesterol, direct   CMP14+EGFR    There are no Patient Instructions on file for this visit.   --Continue cardiac medications as reconciled in final medication list. --Return in about 6 months (around 07/13/2022) for Follow up. Or sooner if needed. --Continue follow-up with your primary care physician regarding the management of your other chronic comorbid conditions.  Patient's questions and concerns were addressed  to his satisfaction. He voices understanding of the instructions provided during this encounter.   This note was created using a voice recognition software as a result there may be grammatical errors inadvertently enclosed that do not reflect the  nature of this encounter. Every attempt is made to correct such errors.  Rex Kras, [place], [facility]  Pager: [phone] Office: [phone]

## 2022-01-11 ENCOUNTER — Other Ambulatory Visit: Payer: Self-pay | Admitting: Cardiology

## 2022-01-11 ENCOUNTER — Other Ambulatory Visit (HOSPITAL_COMMUNITY)
Admission: RE | Admit: 2022-01-11 | Discharge: 2022-01-11 | Disposition: A | Discharge status: Home / Self Care | Payer: 59 | Source: Ambulatory Visit | Attending: Cardiology | Admitting: Cardiology

## 2022-01-11 DIAGNOSIS — E781 Pure hyperglyceridemia: Secondary | ICD-10-CM

## 2022-01-11 LAB — COMPREHENSIVE METABOLIC PANEL
ALT: 31 U/L (ref 0–44)
AST: 27 U/L (ref 15–41)
Albumin: 4.4 g/dL (ref 3.5–5.0)
Alkaline Phosphatase: 63 U/L (ref 38–126)
Anion gap: 10 (ref 5–15)
BUN: 13 mg/dL (ref 6–20)
CO2: 29 mmol/L (ref 22–32)
Calcium: 9.6 mg/dL (ref 8.9–10.3)
Chloride: 101 mmol/L (ref 98–111)
Creatinine, Ser: 0.95 mg/dL (ref 0.61–1.24)
GFR, Estimated: >60 mL/min (ref >60)
Glucose, Bld: 109 mg/dL — ABNORMAL HIGH (ref 70–99)
Potassium: 4.5 mmol/L (ref 3.5–5.1)
Sodium: 140 mmol/L (ref 135–145)
Total Bilirubin: 0.7 mg/dL (ref 0.3–1.2)
Total Protein: 7.6 g/dL (ref 6.5–8.1)

## 2022-01-11 LAB — LIPID PANEL
Cholesterol: 180 mg/dL (ref 0–200)
HDL: 41 mg/dL (ref >40)
LDL Cholesterol: 94 mg/dL (ref 0–99)
Total CHOL/HDL Ratio: 4.4 RATIO
Triglycerides: 225 mg/dL — ABNORMAL HIGH (ref <150)
VLDL: 45 mg/dL — ABNORMAL HIGH (ref 0–40)

## 2022-01-11 LAB — LDL CHOLESTEROL, DIRECT: Direct LDL: 101.7 mg/dL — ABNORMAL HIGH (ref 0–99)

## 2022-01-11 MED ORDER — FENOFIBRATE 145 MG PO TABS: 145.0000 mg | ORAL_TABLET | Freq: Every day | ORAL | 0 refills | Status: DC

## 2022-01-11 NOTE — Progress Notes (Signed)
Lab Results  ?Component Value Date  ? CHOL 180 01/11/2022  ? HDL 41 01/11/2022  ? [place] 94 01/11/2022  ? LDLDIRECT 82.1 12/27/2021  ? TRIG 225 (H) 01/11/2022  ? CHOLHDL 4.4 01/11/2022  ? ?Orders Placed This Encounter  ?Procedures  ? Lipid Panel With LDL/HDL Ratio  ?  Standing Status:   Future  ?  Standing Expiration Date:   01/12/2023  ? LDL cholesterol, direct  ?  Standing Status:   Future  ?  Standing Expiration Date:   01/12/2023  ? CMP14+EGFR  ?  Standing Status:   Future  ?  Standing Expiration Date:   01/12/2023  ? ?Spoke to his wife and shared decision is to start fenofibrate and labs in 6 weeks.  ? ?Rex Kras, DO, FACC ? ?Pager: [phone] ?Office: [phone] ? ?

## 2022-02-03 ENCOUNTER — Other Ambulatory Visit: Payer: Self-pay | Admitting: Cardiology

## 2022-02-17 ENCOUNTER — Other Ambulatory Visit (HOSPITAL_COMMUNITY)
Admission: RE | Admit: 2022-02-17 | Discharge: 2022-02-17 | Disposition: A | Discharge status: Home / Self Care | Payer: PRIVATE HEALTH INSURANCE | Source: Ambulatory Visit | Attending: Cardiology | Admitting: Cardiology

## 2022-02-17 DIAGNOSIS — E781 Pure hyperglyceridemia: Secondary | ICD-10-CM | POA: Insufficient documentation

## 2022-02-17 LAB — COMPREHENSIVE METABOLIC PANEL
ALT: 32 U/L (ref 0–44)
AST: 37 U/L (ref 15–41)
Albumin: 4.2 g/dL (ref 3.5–5.0)
Alkaline Phosphatase: 38 U/L (ref 38–126)
Anion gap: 7 (ref 5–15)
BUN: 21 mg/dL — ABNORMAL HIGH (ref 6–20)
CO2: 26 mmol/L (ref 22–32)
Calcium: 9.2 mg/dL (ref 8.9–10.3)
Chloride: 106 mmol/L (ref 98–111)
Creatinine, Ser: 1.22 mg/dL (ref 0.61–1.24)
GFR, Estimated: >60 mL/min (ref >60)
Glucose, Bld: 95 mg/dL (ref 70–99)
Potassium: 3.3 mmol/L — ABNORMAL LOW (ref 3.5–5.1)
Sodium: 139 mmol/L (ref 135–145)
Total Bilirubin: 0.4 mg/dL (ref 0.3–1.2)
Total Protein: 7.1 g/dL (ref 6.5–8.1)

## 2022-02-17 LAB — LIPID PANEL
Cholesterol: 178 mg/dL (ref 0–200)
HDL: 34 mg/dL — ABNORMAL LOW (ref >40)
LDL Cholesterol: 96 mg/dL (ref 0–99)
Total CHOL/HDL Ratio: 5.2 RATIO
Triglycerides: 239 mg/dL — ABNORMAL HIGH (ref <150)
VLDL: 48 mg/dL — ABNORMAL HIGH (ref 0–40)

## 2022-02-17 LAB — LDL CHOLESTEROL, DIRECT: Direct LDL: 114.5 mg/dL — ABNORMAL HIGH (ref 0–99)

## 2022-02-18 ENCOUNTER — Other Ambulatory Visit: Payer: Self-pay | Admitting: Cardiology

## 2022-02-18 DIAGNOSIS — E876 Hypokalemia: Secondary | ICD-10-CM

## 2022-02-18 DIAGNOSIS — I10 Essential (primary) hypertension: Secondary | ICD-10-CM

## 2022-02-18 DIAGNOSIS — E781 Pure hyperglyceridemia: Secondary | ICD-10-CM

## 2022-02-18 MED ORDER — POTASSIUM CHLORIDE CRYS ER 20 MEQ PO TBCR: 20.0000 meq | EXTENDED_RELEASE_TABLET | Freq: Every day | ORAL | 0 refills | Status: AC

## 2022-02-18 MED ORDER — SPIRONOLACTONE 25 MG PO TABS: 25.0000 mg | ORAL_TABLET | Freq: Every morning | ORAL | 0 refills | Status: DC

## 2022-02-18 MED ORDER — LOSARTAN POTASSIUM 100 MG PO TABS: 100.0000 mg | ORAL_TABLET | Freq: Every day | ORAL | 0 refills | Status: DC

## 2022-02-22 ENCOUNTER — Ambulatory Visit: Payer: 59 | Admitting: Cardiology

## 2022-03-03 ENCOUNTER — Other Ambulatory Visit (HOSPITAL_COMMUNITY)
Admission: RE | Admit: 2022-03-03 | Discharge: 2022-03-03 | Disposition: A | Discharge status: Home / Self Care | Payer: 59 | Source: Ambulatory Visit | Attending: Cardiology | Admitting: Cardiology

## 2022-03-03 DIAGNOSIS — I10 Essential (primary) hypertension: Secondary | ICD-10-CM | POA: Diagnosis not present

## 2022-03-03 DIAGNOSIS — E781 Pure hyperglyceridemia: Secondary | ICD-10-CM | POA: Diagnosis present

## 2022-03-03 LAB — COMPREHENSIVE METABOLIC PANEL
ALT: 36 U/L (ref 0–44)
AST: 37 U/L (ref 15–41)
Albumin: 4.2 g/dL (ref 3.5–5.0)
Alkaline Phosphatase: 38 U/L (ref 38–126)
Anion gap: 5 (ref 5–15)
BUN: 20 mg/dL (ref 6–20)
CO2: 25 mmol/L (ref 22–32)
Calcium: 9.4 mg/dL (ref 8.9–10.3)
Chloride: 110 mmol/L (ref 98–111)
Creatinine, Ser: 1.11 mg/dL (ref 0.61–1.24)
GFR, Estimated: >60 mL/min (ref >60)
Glucose, Bld: 99 mg/dL (ref 70–99)
Potassium: 3.8 mmol/L (ref 3.5–5.1)
Sodium: 140 mmol/L (ref 135–145)
Total Bilirubin: 0.7 mg/dL (ref 0.3–1.2)
Total Protein: 7.3 g/dL (ref 6.5–8.1)

## 2022-03-03 LAB — LIPID PANEL
Cholesterol: 206 mg/dL — ABNORMAL HIGH (ref 0–200)
HDL: 50 mg/dL (ref >40)
LDL Cholesterol: 135 mg/dL — ABNORMAL HIGH (ref 0–99)
Total CHOL/HDL Ratio: 4.1 RATIO
Triglycerides: 107 mg/dL (ref <150)
VLDL: 21 mg/dL (ref 0–40)

## 2022-03-03 LAB — LDL CHOLESTEROL, DIRECT: Direct LDL: 147.2 mg/dL — ABNORMAL HIGH (ref 0–99)

## 2022-03-03 LAB — MAGNESIUM: Magnesium: 2.1 mg/dL (ref 1.7–2.4)

## 2022-03-03 NOTE — Progress Notes (Signed)
Called [facility] and they mention that they have not done any testing since September.

## 2022-03-04 NOTE — Progress Notes (Signed)
Called pt wife answered and was informed about pt lab results. Pt wife understood

## 2022-03-15 ENCOUNTER — Other Ambulatory Visit: Payer: Self-pay | Admitting: Cardiology

## 2022-03-15 DIAGNOSIS — E781 Pure hyperglyceridemia: Secondary | ICD-10-CM

## 2022-03-17 ENCOUNTER — Encounter: Payer: Self-pay | Admitting: Cardiology

## 2022-03-17 ENCOUNTER — Ambulatory Visit: Payer: 59 | Admitting: Cardiology

## 2022-03-17 VITALS — BP 135/89 | HR 59 | Temp 99.0°F | Resp 16 | Ht 67.0 in | Wt 167.0 lb

## 2022-03-17 DIAGNOSIS — F1290 Cannabis use, unspecified, uncomplicated: Secondary | ICD-10-CM

## 2022-03-17 DIAGNOSIS — I10 Essential (primary) hypertension: Secondary | ICD-10-CM

## 2022-03-17 DIAGNOSIS — E7800 Pure hypercholesterolemia, unspecified: Secondary | ICD-10-CM

## 2022-03-17 DIAGNOSIS — Z8616 Personal history of COVID-19: Secondary | ICD-10-CM

## 2022-03-17 DIAGNOSIS — R931 Abnormal findings on diagnostic imaging of heart and coronary circulation: Secondary | ICD-10-CM

## 2022-03-17 DIAGNOSIS — E781 Pure hyperglyceridemia: Secondary | ICD-10-CM

## 2022-03-17 DIAGNOSIS — Z87891 Personal history of nicotine dependence: Secondary | ICD-10-CM

## 2022-03-17 DIAGNOSIS — I2510 Atherosclerotic heart disease of native coronary artery without angina pectoris: Secondary | ICD-10-CM

## 2022-03-17 MED ORDER — ROSUVASTATIN CALCIUM 20 MG PO TABS: 20.0000 mg | ORAL_TABLET | Freq: Every day | ORAL | 0 refills | Status: DC

## 2022-03-17 NOTE — Progress Notes (Signed)
Date:  03/28/2022   ID:  [name], DOB 03-29-1971, MRN [id number]  PCP:  Bridget Hartshorn, NP  Cardiologist:  Rex Kras, DO, [facility] (established care November 10, 2020)  Date: 03/17/22 Last Office Visit: 01/10/2022  Chief Complaint  Patient presents with   Results   Follow-up    HPI  [name] is a 51 y.o. male whose past medical history and cardiovascular risk factors include: COVID 19 Pneumonia, Hypertension, hyperlipidemia, hypertriglyceridemia, mild coronary artery calcification, history of COVID-19 infection, former smoker (08/31/2021 hx of at least 40-year pack history), hx of marijuana smoking.  Initially referred to the practice for evaluation and management of chest pain.  Patient has undergone a coronary CTA which notes mild coronary artery calcification and no hemodynamically significant stenosis.  Since that he has been educated on the importance of improving his modifiable cardiovascular risk factors including lipids and smoking cessation.  Patient has successfully stopped smoking as of November 2022.  He recently had fasting lipid profile done prior to today's office visit which were independently reviewed.  Clinically he denies anginal discomfort or heart failure symptoms.  Patient is accompanied by his wife at today's office visit who provides collateral history.  FUNCTIONAL STATUS: No structured exercise program or daily routine.   ALLERGIES: Allergies  Allergen Reactions   Clarithromycin Other (See Comments)    Caused stomach bleeding   Morphine And Related Nausea And Vomiting    hypertension   Zofran [Ondansetron Hcl] Nausea And Vomiting   Hydrocodone Rash   Oxycodone Rash    MEDICATION LIST PRIOR TO VISIT: Current Meds  Medication Sig   ALPRAZolam (XANAX) 0.5 MG tablet Take 0.5 mg by mouth at bedtime.   amLODipine (NORVASC) 5 MG tablet TAKE ONE TABLET EVERY MORNING (Patient taking differently: Take 5 mg by mouth daily.)   aspirin EC 81 MG  EC tablet Take 1 tablet (81 mg total) by mouth daily. Swallow whole.   fenofibrate (TRICOR) 145 MG tablet TAKE ONE (1) TABLET BY MOUTH EVERY DAY   levothyroxine (SYNTHROID) 175 MCG tablet Take 175 mcg by mouth daily.   losartan (COZAAR) 100 MG tablet Take 1 tablet (100 mg total) by mouth daily at 10 pm.   meloxicam (MOBIC) 15 MG tablet Take 15 mg by mouth daily.   metoprolol tartrate (LOPRESSOR) 25 MG tablet TAKE ONE (1) TABLET BY MOUTH TWO (2) TIMES DAILY   Multiple Vitamin (MULTIVITAMIN) tablet Take 1 tablet by mouth daily.   nitroGLYCERIN (NITROSTAT) 0.4 MG SL tablet Place 1 tablet (0.4 mg total) under the tongue every 5 (five) minutes x 3 doses as needed for chest pain.   omeprazole (PRILOSEC) 20 MG capsule Take 20 mg by mouth daily.   potassium chloride SA (KLOR-CON M) 20 MEQ tablet Take 1 tablet (20 mEq total) by mouth daily.   rosuvastatin (CRESTOR) 20 MG tablet Take 1 tablet (20 mg total) by mouth at bedtime.   spironolactone (ALDACTONE) 25 MG tablet Take 1 tablet (25 mg total) by mouth every morning.   [DISCONTINUED] pravastatin (PRAVACHOL) 80 MG tablet Take 80 mg by mouth daily.     PAST MEDICAL HISTORY: Past Medical History:  Diagnosis Date   Anxiety    Arthritis    Blood transfusion without reported diagnosis    as a newborn   GERD (gastroesophageal reflux disease)    Hypercholesterolemia    Hypertension    Reflux    Thyroid disease     PAST SURGICAL HISTORY: Past Surgical History:  Procedure Laterality Date   CARDIAC CATHETERIZATION     states no findings, in the 1990s   WISDOM TOOTH EXTRACTION      FAMILY HISTORY: The patient family history includes Heart attack in his sister; Heart disease in his sister; Lung cancer in his mother; Pancreatic cancer in his father.  SOCIAL HISTORY:  The patient  reports that he quit smoking about 7 months ago. His smoking use included cigarettes. He has a 20.00 pack-year smoking history. He has never used smokeless tobacco. He  reports current alcohol use. He reports current drug use. Drug: Marijuana.  REVIEW OF SYSTEMS: Review of Systems  Constitutional: Positive for weight gain.  Cardiovascular:  Negative for chest pain, dyspnea on exertion, leg swelling, orthopnea, palpitations, paroxysmal nocturnal dyspnea and syncope.  Respiratory:  Negative for shortness of breath.    PHYSICAL EXAM:    03/17/2022    2:21 PM 03/17/2022    2:16 PM 01/10/2022   12:03 PM  Vitals with BMI  Height  5' 7" 5' 7"  Weight  167 lbs 165 lbs  BMI  26.15 25.84  Systolic 135 155 149  Diastolic 89 91 90  Pulse 59 73 66    CONSTITUTIONAL: Well-developed and well-nourished. No acute distress.  SKIN: Skin is warm and dry. No rash noted. No cyanosis. No pallor. No jaundice HEAD: Normocephalic and atraumatic.  EYES: No scleral icterus MOUTH/THROAT: Moist oral membranes.  NECK: No JVD present. No thyromegaly noted. No carotid bruits  CHEST Normal respiratory effort. No intercostal retractions  LUNGS: Clear to auscultation bilaterally. No stridor. No wheezes. No rales.  CARDIOVASCULAR: Regular rate and rhythm, positive S1-S2, no murmurs rubs or gallops appreciated. ABDOMINAL: No apparent ascites.  EXTREMITIES: No peripheral edema  HEMATOLOGIC: No significant bruising NEUROLOGIC: Oriented to person, place, and time. Nonfocal. Normal muscle tone.  PSYCHIATRIC: Normal mood and affect. Normal behavior. Cooperative  CARDIAC DATABASE: EKG: 09/16/2021: NSR, 62 bpm, left axis deviation, T WI in inferior leads consider inferior ischemia, without underlying injury pattern.  Echocardiogram: 11/26/2020: Normal LV systolic function with visual EF 55-60%. Left ventricle cavity is normal in size. Normal global wall motion. Normal diastolic filling pattern, normal LAP. Mild (Grade I) aortic regurgitation. Mild (Grade I) mitral regurgitation. No prior study for comparison.  08/31/2021: LVEF 55 to 60%, no regional wall motion abnormalities,  normal right ventricular size and function, mild MR.  Stress Testing: 01/11/2021:  1 Day Rest/Stress Protocol. Patient exercised for 10 minutes and 29 seconds on Bruce protocol, achieved 12.58 METS, and 89% of age-predicted maximum heart rate. Stress ECG negative for ischemia. Overall normal myocardial perfusion, without convincing evidence of reversible myocardial ischemia or prior infarct. Left ventricular size: Dilated (EDV 149 cc). Left ventricular wall thickness is preserved without regional wall motion abnormalities. Calculated LVEF 41%. Low risk study.  CAC Score 03/09/2021: Total Agatston score: 7.8  [place] database percentile: 64 No acute or significant extracardiac abnormality.  Heart Catheterization: None  CCTA 10/06/2021: 1. Total coronary calcium score of 6.03. This was 59th percentile for age and sex matched control.   2. Normal coronary origin with co-dominance.   3. CAD-RADS = 3. Left Main: Patent.  LAD: Mild stenosis (25-49%) due to non-calcified plaque at mid/distal LAD, otherwise the vessel is patent.   LCX: Moderate stenosis (50-69%) closer to 50% due to mixed plaque at the mid/distal LCx (after the second OM); otherwise the vessel is patent.   RCA: Mild stenosis (25-49%) due to non-calcified plaque at mid/distal RCA, otherwise vessel is   patent.   4. Aortic atherosclerosis.   5. Study is sent for CT-FFR findings will be performed and reported separately.  6. No acute or significant extracardiac abnormality.  CT-FFR December 2022: CT FFR analysis showed no significant stenosis.  LABORATORY DATA:    Latest Ref Rng & Units 08/31/2021    5:51 AM 08/30/2021    6:12 PM 04/30/2017   12:09 AM  CBC  WBC 4.0 - 10.5 K/uL 4.5   4.3   9.7    Hemoglobin 13.0 - 17.0 g/dL 13.4   14.4   14.5    Hematocrit 39.0 - 52.0 % 38.2   41.2   42.5    Platelets 150 - 400 K/uL 84   85   168         Latest Ref Rng & Units 03/03/2022   10:21 AM 02/17/2022    3:07 PM 01/11/2022     9:04 AM  CMP  Glucose 70 - 99 mg/dL 99   95   109    BUN 6 - 20 mg/dL _0 Creatinine 0.61 - 1.24 mg/dL 1.11   1.22   0.95    Sodium 135 - 145 mmol/L 140   139   140    Potassium 3.5 - 5.1 mmol/L 3.8   3.3   4.5    Chloride 98 - 111 mmol/L 110   106   101    CO2 22 - 32 mmol/L _1 Calcium 8.9 - 10.3 mg/dL 9.4   9.2   9.6    Total Protein 6.5 - 8.1 g/dL 7.3   7.1   7.6    Total Bilirubin 0.3 - 1.2 mg/dL 0.7   0.4   0.7    Alkaline Phos 38 - 126 U/L 38   38   63    AST 15 - 41 U/L 37   37   27    ALT 0 - 44 U/L 36   32   31      Lipid Panel     Component Value Date/Time   CHOL 206 (H) 03/03/2022 1022   TRIG 107 03/03/2022 1022   HDL 50 03/03/2022 1022   CHOLHDL 4.1 03/03/2022 1022   VLDL 21 03/03/2022 1022   LDLCALC 135 (H) 03/03/2022 1022   LDLDIRECT 147.2 (H) 03/03/2022 1021    No components found for: NTPROBNP No results for input(s): PROBNP in the last 8760 hours. Recent Labs    08/30/21 1812  TSH 3.081    BMP Recent Labs    01/11/22 0904 02/17/22 1507 03/03/22 1021  NA 140 139 140  K 4.5 3.3* 3.8  CL 101 106 110  CO2 _2 GLUCOSE 109* 95 99  BUN 13 21* 20  CREATININE 0.95 1.22 1.11  CALCIUM 9.6 9.2 9.4  GFRNONAA >60 >60 >60    HEMOGLOBIN A1C Lab Results  Component Value Date   HGBA1C 5.4 08/30/2021   MPG 108.28 08/30/2021     External Labs: Collected: 06/26/2020 TSH: 1.26  IMPRESSION:    ICD-10-CM   1. Pure hypercholesterolemia  E78.00 rosuvastatin (CRESTOR) 20 MG tablet    Lipid Panel With LDL/HDL Ratio    LDL cholesterol, direct    CMP14+EGFR    2. Hypertriglyceridemia  E78.1 Lipid Panel With LDL/HDL Ratio    LDL cholesterol, direct    CMP14+EGFR    3. Nonobstructive atherosclerosis of  coronary artery  I25.10     4. Agatston coronary artery calcium score less than 100  R93.1     5. Benign hypertension  I10     6. History of COVID-19  Z86.16     7. Former smoker  Z87.891     8. Marijuana smoker   F12.90         RECOMMENDATIONS: Eaven D Bradfield is a 50 y.o. male whose past medical history and cardiac risk factors include: Hypertension, hyperlipidemia, mild coronary artery calcification, history of COVID-19 infection, former smoker (08/31/2021 hx of at least 40-year pack history), hx of marijuana smoking.  Pure hypercholesterolemia Patient is direct LDL remains elevated, 147 mg/dL. Given the degree of coronary artery calcification and nonobstructive CAD recommend better LDL management. Discontinue pravastatin 80 mg p.o. nightly. Start Crestor 20 mg p.o. nightly. Labs in 6 weeks to reevaluate lipid panel If 6 weeks labs are stable we will postpone the follow-up appointment to 6 months.  Hypertriglyceridemia Triglyceride levels have improved significantly since initiation of pharmacological therapy. Fasting lipid profile from Mar 03, 2022 independently reviewed. Continue current pharmacological therapy  Nonobstructive atherosclerosis of coronary artery / Agatston coronary artery calcium score less than 100 Continue aspirin and statin therapy. Ischemic work-up reviewed as part of medical decision making at today's office visit. Denies angina pectoris. Continue medical management and improving modifiable cardiovascular risk factors.  Benign hypertension Office blood pressures are well controlled. Continue current medical therapy  Former smoker Successfully stopped smoking as of November 2022. Congratulated on his efforts.  Importance reemphasized  FINAL MEDICATION LIST END OF ENCOUNTER: Meds ordered this encounter  Medications   rosuvastatin (CRESTOR) 20 MG tablet    Sig: Take 1 tablet (20 mg total) by mouth at bedtime.    Dispense:  90 tablet    Refill:  0      Current Outpatient Medications:    ALPRAZolam (XANAX) 0.5 MG tablet, Take 0.5 mg by mouth at bedtime., Disp: , Rfl:    amLODipine (NORVASC) 5 MG tablet, TAKE ONE TABLET EVERY MORNING (Patient taking differently:  Take 5 mg by mouth daily.), Disp: 30 tablet, Rfl: 0   aspirin EC 81 MG EC tablet, Take 1 tablet (81 mg total) by mouth daily. Swallow whole., Disp: 30 tablet, Rfl: 11   fenofibrate (TRICOR) 145 MG tablet, TAKE ONE (1) TABLET BY MOUTH EVERY DAY, Disp: 90 tablet, Rfl: 0   levothyroxine (SYNTHROID) 175 MCG tablet, Take 175 mcg by mouth daily., Disp: , Rfl:    losartan (COZAAR) 100 MG tablet, Take 1 tablet (100 mg total) by mouth daily at 10 pm., Disp: 90 tablet, Rfl: 0   meloxicam (MOBIC) 15 MG tablet, Take 15 mg by mouth daily., Disp: , Rfl:    metoprolol tartrate (LOPRESSOR) 25 MG tablet, TAKE ONE (1) TABLET BY MOUTH TWO (2) TIMES DAILY, Disp: 60 tablet, Rfl: 2   Multiple Vitamin (MULTIVITAMIN) tablet, Take 1 tablet by mouth daily., Disp: , Rfl:    nitroGLYCERIN (NITROSTAT) 0.4 MG SL tablet, Place 1 tablet (0.4 mg total) under the tongue every 5 (five) minutes x 3 doses as needed for chest pain., Disp: 30 tablet, Rfl: 0   omeprazole (PRILOSEC) 20 MG capsule, Take 20 mg by mouth daily., Disp: , Rfl: 3   potassium chloride SA (KLOR-CON M) 20 MEQ tablet, Take 1 tablet (20 mEq total) by mouth daily., Disp: 180 tablet, Rfl: 0   rosuvastatin (CRESTOR) 20 MG tablet, Take 1 tablet (20 mg total) by mouth at   bedtime., Disp: 90 tablet, Rfl: 0   spironolactone (ALDACTONE) 25 MG tablet, Take 1 tablet (25 mg total) by mouth every morning., Disp: 90 tablet, Rfl: 0  Orders Placed This Encounter  Procedures   Lipid Panel With LDL/HDL Ratio   LDL cholesterol, direct   CMP14+EGFR    There are no Patient Instructions on file for this visit.   --Continue cardiac medications as reconciled in final medication list. --Return in about 6 months (around 09/17/2022) for Follow up, Coronary artery calcification, Lipid. Or sooner if needed. --Continue follow-up with your primary care physician regarding the management of your other chronic comorbid conditions.  Patient's questions and concerns were addressed to his  satisfaction. He voices understanding of the instructions provided during this encounter.   This note was created using a voice recognition software as a result there may be grammatical errors inadvertently enclosed that do not reflect the nature of this encounter. Every attempt is made to correct such errors.  Sunit Tolia, DO, FACC  Pager: [phone] Office: [phone]   

## 2022-04-13 ENCOUNTER — Other Ambulatory Visit: Payer: Self-pay | Admitting: Cardiology

## 2022-04-13 DIAGNOSIS — I10 Essential (primary) hypertension: Secondary | ICD-10-CM

## 2022-04-13 DIAGNOSIS — E876 Hypokalemia: Secondary | ICD-10-CM

## 2022-04-19 ENCOUNTER — Telehealth: Payer: Self-pay

## 2022-04-19 NOTE — Telephone Encounter (Signed)
Spoke with patient's wife. Patient reportedly has been taking Crestor for the last 1 week and delveloped sore throat and generalized weakness today. Advised that patient may hold rosuvastatin for 1 week.  If symptoms improve holding rosuvastatin advised patient to call our office and update so that we may make further recommendations.  If symptoms fail to improve recommended that patient resume rosuvastatin and follow-up with PCP for further evaluation.  Saraii, please call patient and follow up in 1 week regarding symptoms following stating holiday.

## 2022-04-19 NOTE — Telephone Encounter (Signed)
Pts wife called and stated that the pt started rosuvastatin about a week ago and today he is SOB, has a sore throat, and feels very weak. Pts BP is 145/89 and HR is 49. Advised the pts wife that if symptoms worsen or if his throat begins to feel like it is closing to go to the emergency room.

## 2022-04-26 MED ORDER — ATORVASTATIN CALCIUM 40 MG PO TABS: 40.0000 mg | ORAL_TABLET | Freq: Every day | ORAL | 0 refills | Status: AC

## 2022-05-18 ENCOUNTER — Other Ambulatory Visit: Payer: Self-pay | Admitting: Cardiology

## 2022-05-18 DIAGNOSIS — I10 Essential (primary) hypertension: Secondary | ICD-10-CM

## 2022-05-18 DIAGNOSIS — E876 Hypokalemia: Secondary | ICD-10-CM

## 2022-06-15 ENCOUNTER — Other Ambulatory Visit: Payer: Self-pay | Admitting: Cardiology

## 2022-06-15 DIAGNOSIS — E781 Pure hyperglyceridemia: Secondary | ICD-10-CM

## 2022-08-17 ENCOUNTER — Other Ambulatory Visit: Payer: Self-pay | Admitting: Cardiology

## 2022-08-17 DIAGNOSIS — I10 Essential (primary) hypertension: Secondary | ICD-10-CM

## 2022-08-17 DIAGNOSIS — E876 Hypokalemia: Secondary | ICD-10-CM

## 2022-09-16 ENCOUNTER — Other Ambulatory Visit: Payer: Self-pay | Admitting: Cardiology

## 2022-09-16 DIAGNOSIS — E781 Pure hyperglyceridemia: Secondary | ICD-10-CM

## 2022-09-16 DIAGNOSIS — I10 Essential (primary) hypertension: Secondary | ICD-10-CM

## 2022-09-19 ENCOUNTER — Ambulatory Visit: Payer: 59 | Admitting: Cardiology

## 2022-11-24 ENCOUNTER — Other Ambulatory Visit: Payer: Self-pay | Admitting: Cardiology

## 2022-11-24 DIAGNOSIS — E876 Hypokalemia: Secondary | ICD-10-CM

## 2022-11-24 DIAGNOSIS — I10 Essential (primary) hypertension: Secondary | ICD-10-CM

## 2023-01-17 IMAGING — DX DG CHEST 1V
1 series · 1 of 1 positions shown · non-contrast
Comparison: Portable exam 6878 hours compared to 04/30/2017

CLINICAL DATA: [id number]

EXAM:
CHEST  1 VIEW

[chest ap]
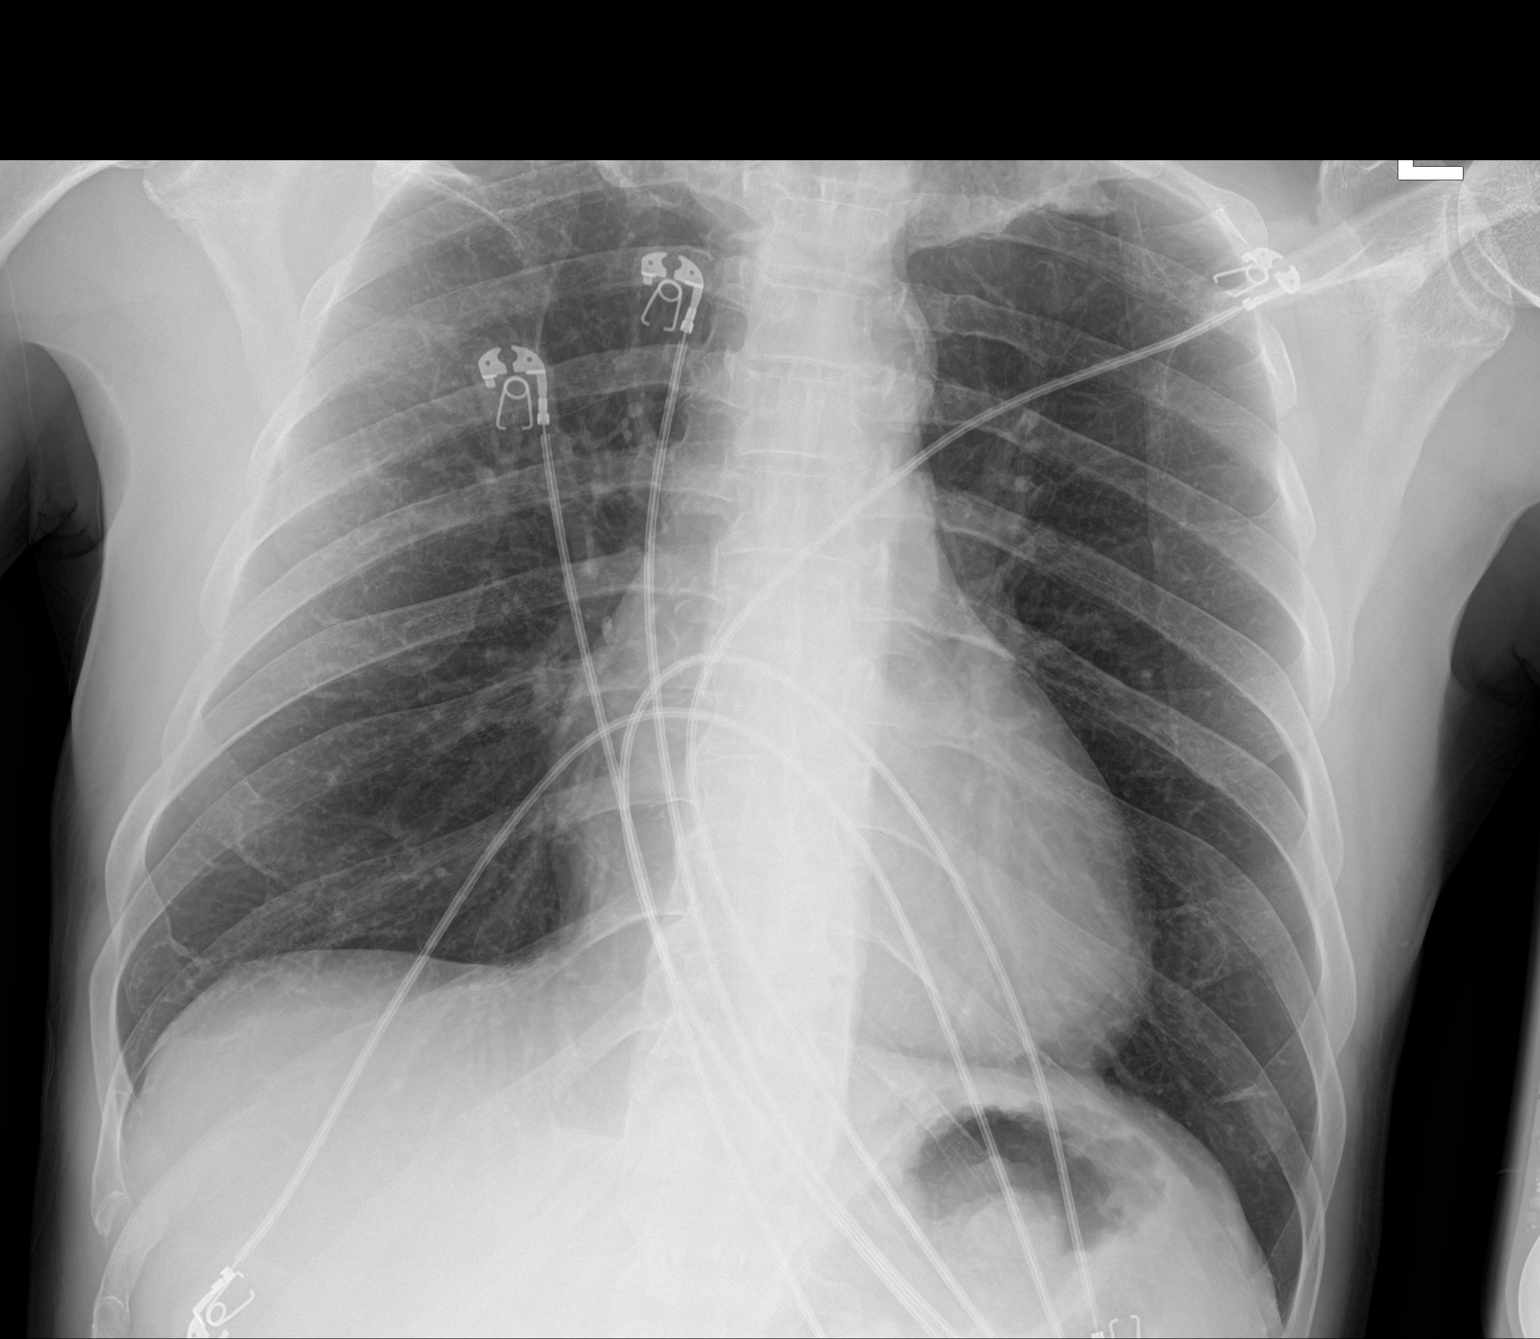

[1 of 1 positions shown; findings below may reference images not displayed]

FINDINGS: Normal heart size, mediastinal contours, and pulmonary vascularity.

Atherosclerotic calcifications aorta.

Lungs clear.

No pulmonary infiltrate, pleural effusion, or pneumothorax.

Bones unremarkable.
IMPRESSION: No acute abnormalities.

Aortic Atherosclerosis ([id number]).

## 2023-02-01 ENCOUNTER — Other Ambulatory Visit: Payer: Self-pay | Admitting: Cardiology

## 2023-02-01 DIAGNOSIS — I10 Essential (primary) hypertension: Secondary | ICD-10-CM

## 2023-03-01 ENCOUNTER — Other Ambulatory Visit: Payer: Self-pay | Admitting: Cardiology

## 2023-03-01 DIAGNOSIS — I10 Essential (primary) hypertension: Secondary | ICD-10-CM

## 2023-04-27 ENCOUNTER — Other Ambulatory Visit: Payer: Self-pay | Admitting: Cardiology

## 2023-04-27 DIAGNOSIS — I10 Essential (primary) hypertension: Secondary | ICD-10-CM

## 2023-06-30 ENCOUNTER — Other Ambulatory Visit: Payer: Self-pay | Admitting: Cardiology

## 2023-06-30 DIAGNOSIS — E876 Hypokalemia: Secondary | ICD-10-CM

## 2023-06-30 DIAGNOSIS — I10 Essential (primary) hypertension: Secondary | ICD-10-CM

## 2023-07-05 NOTE — Telephone Encounter (Signed)
This encounter was created in error - please disregard.

## 2023-08-02 ENCOUNTER — Other Ambulatory Visit: Payer: Self-pay | Admitting: Cardiology

## 2023-08-02 DIAGNOSIS — E876 Hypokalemia: Secondary | ICD-10-CM

## 2023-08-02 DIAGNOSIS — I10 Essential (primary) hypertension: Secondary | ICD-10-CM

## 2023-08-30 ENCOUNTER — Other Ambulatory Visit: Payer: Self-pay | Admitting: Cardiology

## 2023-08-30 DIAGNOSIS — E876 Hypokalemia: Secondary | ICD-10-CM

## 2023-08-30 DIAGNOSIS — I10 Essential (primary) hypertension: Secondary | ICD-10-CM
